# Patient Record
Sex: Female | Born: 1975 | Race: White | Hispanic: No | Smoking: Former smoker
Health system: Southern US, Community
[De-identification: ages and names within clinical notes are randomized; demographics above are authoritative.]

## PROBLEM LIST (undated history)

## (undated) DIAGNOSIS — M1990 Unspecified osteoarthritis, unspecified site: Secondary | ICD-10-CM

## (undated) DIAGNOSIS — K5790 Diverticulosis of intestine, part unspecified, without perforation or abscess without bleeding: Secondary | ICD-10-CM

## (undated) DIAGNOSIS — K649 Unspecified hemorrhoids: Secondary | ICD-10-CM

## (undated) DIAGNOSIS — M797 Fibromyalgia: Secondary | ICD-10-CM

## (undated) DIAGNOSIS — R51 Headache: Secondary | ICD-10-CM

## (undated) HISTORY — DX: Unspecified osteoarthritis, unspecified site: M19.90

## (undated) HISTORY — DX: Diverticulosis of intestine, part unspecified, without perforation or abscess without bleeding: K57.90

## (undated) HISTORY — DX: Fibromyalgia: M79.7

## (undated) HISTORY — PX: OTHER SURGICAL HISTORY: SHX169

## (undated) HISTORY — DX: Unspecified hemorrhoids: K64.9

---

## 1997-09-30 ENCOUNTER — Other Ambulatory Visit: Admission: RE | Admit: 1997-09-30 | Discharge: 1997-09-30 | Payer: Self-pay | Admitting: Obstetrics and Gynecology

## 1997-11-22 ENCOUNTER — Encounter: Admission: RE | Admit: 1997-11-22 | Discharge: 1998-02-20 | Payer: Self-pay | Admitting: Obstetrics & Gynecology

## 1998-06-16 ENCOUNTER — Inpatient Hospital Stay (HOSPITAL_COMMUNITY): Admission: AD | Admit: 1998-06-16 | Discharge: 1998-06-19 | Payer: Self-pay | Admitting: Obstetrics and Gynecology

## 1998-07-24 ENCOUNTER — Other Ambulatory Visit: Admission: RE | Admit: 1998-07-24 | Discharge: 1998-07-24 | Payer: Self-pay | Admitting: Obstetrics and Gynecology

## 1999-05-14 HISTORY — PX: DIAGNOSTIC LAPAROSCOPY: SUR761

## 1999-07-24 ENCOUNTER — Other Ambulatory Visit: Admission: RE | Admit: 1999-07-24 | Discharge: 1999-07-24 | Payer: Self-pay | Admitting: Obstetrics and Gynecology

## 2000-01-04 ENCOUNTER — Encounter (INDEPENDENT_AMBULATORY_CARE_PROVIDER_SITE_OTHER): Payer: Self-pay

## 2000-01-04 ENCOUNTER — Other Ambulatory Visit: Admission: RE | Admit: 2000-01-04 | Discharge: 2000-01-04 | Payer: Self-pay | Admitting: Obstetrics and Gynecology

## 2000-04-24 ENCOUNTER — Ambulatory Visit (HOSPITAL_COMMUNITY): Admission: RE | Admit: 2000-04-24 | Discharge: 2000-04-24 | Payer: Self-pay | Admitting: Obstetrics and Gynecology

## 2000-04-24 ENCOUNTER — Encounter (INDEPENDENT_AMBULATORY_CARE_PROVIDER_SITE_OTHER): Payer: Self-pay | Admitting: Specialist

## 2000-05-23 ENCOUNTER — Encounter (INDEPENDENT_AMBULATORY_CARE_PROVIDER_SITE_OTHER): Payer: Self-pay

## 2000-05-23 ENCOUNTER — Other Ambulatory Visit: Admission: RE | Admit: 2000-05-23 | Discharge: 2000-05-23 | Payer: Self-pay | Admitting: Obstetrics and Gynecology

## 2000-10-07 ENCOUNTER — Other Ambulatory Visit: Admission: RE | Admit: 2000-10-07 | Discharge: 2000-10-07 | Payer: Self-pay | Admitting: Obstetrics and Gynecology

## 2008-05-13 HISTORY — PX: ABDOMINAL HYSTERECTOMY: SHX81

## 2008-07-26 ENCOUNTER — Ambulatory Visit (HOSPITAL_COMMUNITY): Admission: RE | Admit: 2008-07-26 | Discharge: 2008-07-27 | Payer: Self-pay | Admitting: Obstetrics and Gynecology

## 2008-07-26 ENCOUNTER — Encounter (INDEPENDENT_AMBULATORY_CARE_PROVIDER_SITE_OTHER): Payer: Self-pay | Admitting: Obstetrics and Gynecology

## 2010-08-23 LAB — COMPREHENSIVE METABOLIC PANEL
ALT: 19 U/L (ref 0–35)
AST: 16 U/L (ref 0–37)
Albumin: 3.4 g/dL — ABNORMAL LOW (ref 3.5–5.2)
Alkaline Phosphatase: 40 U/L (ref 39–117)
Calcium: 8.6 mg/dL (ref 8.4–10.5)
GFR calc Af Amer: >60 mL/min (ref >60)
Potassium: 3.4 mEq/L — ABNORMAL LOW (ref 3.5–5.1)
Sodium: 136 mEq/L (ref 135–145)
Total Protein: 6 g/dL (ref 6.0–8.3)

## 2010-08-23 LAB — CBC
HCT: 31.9 % — ABNORMAL LOW (ref 36.0–46.0)
Hemoglobin: 11 g/dL — ABNORMAL LOW (ref 12.0–15.0)
MCHC: 34.3 g/dL (ref 30.0–36.0)
MCHC: 33.9 g/dL (ref 30.0–36.0)
Platelets: 138 10*3/uL — ABNORMAL LOW (ref 150–400)
Platelets: 166 10*3/uL (ref 150–400)
RDW: 13.3 % (ref 11.5–15.5)
RDW: 13.4 % (ref 11.5–15.5)

## 2010-08-23 LAB — HCG, SERUM, QUALITATIVE: Preg, Serum: NEGATIVE

## 2010-09-25 NOTE — Op Note (Signed)
[name], [name]                 ACCOUNT NO.:  [id number]   MEDICAL RECORD NO.:  [id number]          PATIENT TYPE:  OIB   LOCATION:  9304                          FACILITY:  [facility]   PHYSICIAN:  Guy Sandifer. Henderson Cloud, M.D. DATE OF BIRTH:  Jun 28, 1975   DATE OF PROCEDURE:  07/26/2008  DATE OF DISCHARGE:                               OPERATIVE REPORT   PREOPERATIVE DIAGNOSES:  Dysmenorrhea and menorrhagia.   POSTOPERATIVE DIAGNOSES:  Endometriosis, menorrhagia, and left ovarian  cyst.   PROCEDURE:  Laparoscopically-assisted vaginal hysterectomy, aspiration  of left ovarian cyst, and ablation of endometriosis.   SURGEON:  Guy Sandifer. Henderson Cloud, MD   ASSISTANT:  Duke Salvia. Marcelle Overlie, MD   ANESTHESIA:  General endotracheal intubation.   SPECIMENS:  Uterus to pathology.   ESTIMATED BLOOD LOSS:  200 mL.   INDICATIONS AND CONSENT:  This patient is a 35 year old divorced white  female G2 P2, having frequent painful heavy menses.  Details are  dictated in the history and physical.  She is being admitted for  surgical management.  Laparoscopically-assisted vaginal hysterectomy and  removal of 1 tube and ovary if distinctly abnormal have been discussed  preoperatively.  Potential risks and complications have been discussed  with the patient including, but not limited to infection, organ damage,  bleeding requiring transfusion of blood products with HIV and hepatitis  acquisition, DVT, PE, pneumonia, fistula formation, pelvic pain,  dyspareunia, laparotomy.  All questions have been answered and consent  is signed on the chart.   FINDINGS:  Upper abdomen is grossly normal.  Appendix is normal.  Uterus  is 8 weeks' in size.  Anterior cul-de-sac contains multiple dark black  powder burn type endometriotic lesions approximately 3-6-mm in diameter.  There is an implant in the posterior cul-de-sac on the left-hand side as  well as a implant on the left pelvic sidewall below the course of the  left  ureter.  There is a 3 cm smooth translucent cyst on the left ovary.   PROCEDURE:  The patient was taken to the operating room where she is  identified, placed in dorsal supine position, and general anesthesia was  induced via endotracheal intubation.  She is then placed in the dorsal  lithotomy position where she is prepped, bladder straight catheterized.  Hulka tenaculum was placed.  The uterus was manipulated.  She is draped  in a sterile fashion.  Time-out was conducted.  The infraumbilical and  suprapubic areas were then injected with 6 mL total of 1% plain  Xylocaine.  A small infraumbilical incision is made.  The Veress needle  was placed.  However, it is felt to be subfascial.  Therefore, an open  laparoscopy was done.  The fascia is grasped and under good  visualization is carefully entered and the peritoneum and bluntly  entered.  Anchoring sutures of 0 Vicryl were placed on each angle of the  fascia and a disposable Hassan trocar sleeve was placed and tied down  with the anchoring sutures.  Pneumoperitoneum is created.  A small  suprapubic incision was made in the midline and a 5-mm  disposable trocar  sleeve was placed under direct visualization.  The balloon on the 5-mm  trocar sleeve was inflated.  The above findings were noted.  The areas  of endometriosis were ablated with bipolar cautery.  Care was taken to  avoid the course of the ureters.  The left ovarian cyst is aspirated.  Only a few bubbles of the cyst fluid make it to the syringe.  Small  amount of serous fluid is noted to egress from the cyst.  The cyst wall  was grasped with the EnSeal bipolar cautery and good hemostasis was  obtained and the cyst wall was obliterated.  Then using the EnSeal  bipolar cautery cutting instruments, proximal ligaments were taken down  bilaterally to the level of the vesicouterine peritoneum.  Good  hemostasis is noted.  The vesicouterine peritoneum is taken down  cephalad laterally  with scissors.  Instruments are removed and attention  is turned to the vagina.  Posterior cul-de-sacs entered sharply.  Cervix  is circumscribed unipolar cautery.  Mucosa is advanced sharply and  bluntly.  Then using the gyrus bipolar cautery instrument, uterosacral  ligaments were taken down followed by the bladder pillars, cardinal  ligaments, and the uterine vessels.  Fundus is delivered posteriorly.  Remaining pedicles were taken down, and the specimen was delivered.  Uterosacral ligaments were then plicated at the vaginal cuff with  separate sutures of 0 Monocryl.  All suture will be 0 Monocryl unless  otherwise designated.  Another suture was placed at 3 and 9 o'clock  position of the cuff to ensure complete hemostasis.  Cuff was then  closed with figure-of-eights.  Foley catheter was placed in the bladder  and clear urine is noted.  Attention was returned to the abdomen.  Pneumoperitoneum was recreated.  Copious irrigation is carried out and  careful inspection reveals good hemostasis all around.  A 14 mL of the  remaining 1% Xylocaine plain were then instilled into the peritoneal  cavity.  Trocar sleeves were removed.  The fascia of the umbilical  incision was closed with 0 Vicryl under good visualization.  Skin on  both incisions was closed with subcuticular 3-0 Vicryl suture.  Dermabond was placed on both incisions.  All counts were correct.  The  patient is awakened, taken to recovery room in stable condition.      Guy Sandifer Henderson Cloud, M.D.  Electronically Signed     JET/MEDQ  D:  07/26/2008  T:  07/26/2008  Job:  [id number]

## 2010-09-25 NOTE — Discharge Summary (Signed)
[name], [name]                 ACCOUNT NO.:  [id number]   MEDICAL RECORD NO.:  [id number]          PATIENT TYPE:  OIB   LOCATION:  9304                          FACILITY:  [facility]   PHYSICIAN:  Guy Sandifer. Henderson Cloud, M.D. DATE OF BIRTH:  1975-09-26   DATE OF ADMISSION:  07/26/2008  DATE OF DISCHARGE:  07/27/2008                               DISCHARGE SUMMARY   ADMITTING DIAGNOSES:  1. Dysmenorrhea.  2. Menorrhagia.   DISCHARGE DIAGNOSES:  1. Endometriosis.  2. Menorrhagia.  3. Left ovarian cyst.   PROCEDURE:  On July 26, 2008, laparoscopically assisted vaginal  hysterectomy, aspiration of left ovarian cyst, and ablation of  endometriosis.   REASON FOR ADMISSION:  This patient is a 35 year old divorced white  female G2, P2, with heavy frequent menses.  Details are dictated in the  history and physical.  She is admitted for surgical management.   HOSPITAL COURSE:  The patient has been in the hospital and undergoes the  above procedure.  Estimated blood loss is 200 mL.  On the evening of  surgery, she has changed from Dilaudid to morphine PCA for better pain  control.  Vital signs are stable.  She is afebrile.  Urine output is  clear.  Abdomen is flat and soft with good bowel sounds.  On the day of  discharge, she is tolerating regular diet, passing flatus, ambulating  well.  She remains afebrile with stable vital signs.  Hemoglobin is  11.0.  Pathology is pending.   CONDITION ON DISCHARGE:  Good.   DIET:  Regular as tolerated.   ACTIVITY:  No lifting, no operation of automobiles, and no vaginal  entry.  She is to call the office for problems including, not limited to  temperature of 101 degrees, persistent nausea, vomiting, heavy bleeding,  or increasing pain.   MEDICATIONS:  1. Percocet 5/325 mg #50 one to two p.o. q.6 h. p.r.n., no refills.  2. Phenergan 25 mg #20 one p.o. q.6 h. p.r.n. nausea, vomiting, no      refills.  3. Multivitamin daily.  4. Naproxen q.12 h.  p.r.n.   FOLLOWUP:  In the office in 2 weeks.      Guy Sandifer Henderson Cloud, M.D.  Electronically Signed     JET/MEDQ  D:  07/27/2008  T:  07/28/2008  Job:  [id number]

## 2010-09-25 NOTE — H&P (Signed)
NAMESADHANA, [name]                 ACCOUNT NO.:  [id number]   MEDICAL RECORD NO.:  [id number]          PATIENT TYPE:  AMB   LOCATION:                                FACILITY:  [facility]   PHYSICIAN:  Guy Sandifer. Henderson Cloud, M.D. DATE OF BIRTH:  February 28, 1976   DATE OF ADMISSION:  07/26/2008  DATE OF DISCHARGE:                              HISTORY & PHYSICAL   CHIEF COMPLAINT:  Heavy painful menses.   HISTORY OF PRESENT ILLNESS:  The patient is a 35 year old divorced white  female, G2, P2,who is having menses about every 2 weeks.  She has a  known history of endometriosis and is having increasingly severe  premenstrual pain.  After discussion of options, she is being admitted  for laparoscopically-assisted vaginal hysterectomy and possible removal  of one tube and ovary if abnormal.  Potential risks and complications  have been discussed preoperatively.   PAST MEDICAL HISTORY:  1. History of abnormal Pap smear.  2. History of migraine headache.  3. The patient lost 120 pounds with diet and exercise in the last 3-4      years.   SURGICAL HISTORY:  Laparoscopy in 2001.   OBSTETRIC HISTORY:  Cesarean section x2.   FAMILY HISTORY:  Positive for heart disease, respiratory disease,  hepatitis, tuberculosis, urinary tract infection, diverticulosis,  arthritis, diabetes, hypertension, and cancer.   SOCIAL HISTORY:  Smokes a pack a day.  Denies drug or alcohol abuse.   MEDICATIONS:  Phenergan p.r.n., hydrocodone p.r.n.   No known drug allergies.   REVIEW OF SYSTEMS:  NEUROLOGIC:  History of headache as above.  CARDIAC:  Denies chest pain.  PULMONARY:  Denies shortness of breath.   PHYSICAL EXAMINATION:  VITAL SIGNS:  Height 5 feet 8 inches, weight  140.2 pounds, blood pressure 120/88.  HEENT: Without thyromegaly.  LUNGS:  Clear to auscultation.  HEART:  Regular rate and rhythm.  BACK:  No CVA tenderness.  BREASTS:  No mass, traction, or discharge.  ABDOMEN:  Soft, nontender without masses.  PELVIC:  Vulva, vagina, cervix without lesion.  Uterus is anteverted,  normal size, mobile.  Left adnexa tender without palpable masses.  Right  adnexa nontender without masses.  EXTREMITIES:  Grossly within normal limits.  NEUROLOGIC:  Grossly within normal limits.   ASSESSMENT:  Dysmenorrhea, menorrhagia.   PLAN:  Laparoscopically-assisted vaginal hysterectomy, removal of tube  and ovary if abnormal.      Guy Sandifer. Henderson Cloud, M.D.  Electronically Signed     JET/MEDQ  D:  07/21/2008  T:  07/22/2008  Job:  [id number]

## 2010-09-28 NOTE — H&P (Signed)
[facility]  Patient:    [name], [name]                          MRN: [id number] Adm. Date:  04/24/00 Attending:  Guy Sandifer. Arleta Creek, M.D.                         History and Physical  CHIEF COMPLAINT:              Pelvic pain.  HISTORY OF PRESENT ILLNESS:   This patient is a 35 year old white female, G2, P2, who has increasingly severe pelvic pain and dysmenorrhea.  She gets only partial relief at best with the birth control pill.  After discussion of the options, she is being admitted for laparoscopy.  PAST MEDICAL HISTORY:         1. Renal reflux age 55.                               2. Migraine headaches.                               3. History of depression.                               4. Ruptured ovarian cyst at age 21.                               5. Chlamydia 1992.                               6. Cervical dysplasia.  PAST SURGICAL HISTORY:        Negative.  MEDICATIONS:                  Wellbutrin 150 mg b.i.d., Estrostep q.d., Xanax p.r.n.  ALLERGIES:                    CODEINE - leads to nausea.  OBSTETRIC HISTORY:            Status post cesarean section x 2.  SOCIAL HISTORY:               Patient smokes less than one pack of cigarettes a day.  Denies alcohol or drug abuse.  REVIEW OF SYSTEMS:            Negative, except as above.  PHYSICAL EXAMINATION:  GENERAL:                      Height 5 feet, 7 inches, weight 210 pounds.  VITAL SIGNS:                  Blood pressure 118/80.  HEENT:                        Without thyromegaly.  LUNGS:                        Clear to auscultation.  HEART:  Regular rate and rhythm.  BACK:                         Without CVA tenderness.  BREASTS:                      Without mass, retraction, discharge.  ABDOMEN:                      Obese, soft, nontender.  PELVIC EXAMINATION:           Vulva, vagina, cervix without lesion.  Uterus is anteverted, normal size,  mildly tender.  Adnexa nontender without masses.  EXTREMITIES/NEUROLOGIC:       Grossly within normal limits.  ASSESSMENT:                   Dysmenorrhea and pelvic pain.  PLAN:                         Laparoscopy. DD:  04/23/00 TD:  04/23/00 Job: [id number]

## 2010-09-28 NOTE — Op Note (Signed)
[facility]  Patient:    [name], [name]                        MRN: [id number] Proc. Date: 04/24/00 Adm. Date:  [id number] Attending:  Cordelia Pen Ii                           Operative Report  PREOPERATIVE DIAGNOSIS:       Pelvic pain.  POSTOPERATIVE DIAGNOSIS:      Endometriosis and pelvic adhesions.  OPERATION:                    Laparoscopy with ablation and resection of                               endometriosis and lysis of adhesions.  SURGEON:                      Guy Sandifer. Arleta Creek, M.D.  ANESTHESIA:                   General with endotracheal intubation.  ESTIMATED BLOOD LOSS:         Drops.  INDICATIONS AND CONSENT:      This patient is a 35 year old white female, G2, P2, with pelvic pain.  Details are dictated in the history and physical. Laparoscopy has been discussed with the patient.  Possible risks and complications have been discussed, including but not limited to infection, bowel, bladder, or ureteral damage, bleeding requiring transfusion of blood products with possible transfusion reaction, HIV, and hepatitis acquisition, DVT, PE, pneumonia.  All questions are answered, and consent is signed on the chart.  FINDINGS:                     Upper abdomen appears normal.  Uterus is normal in size and contour.  Anterior cul-de-sac contains adhesion in the center of the vesicouterine peritoneum.  Posterior cul-de-sac contains several dark-black and white implants of endometriosis.  Left pelvic sidewall contains puckered white peritoneum consistent with endometriosis just above the course of the left ureter.  Right pelvic sidewall contains two small areas possibly consistent with endometriosis below the course of the ureter and above the course of the ureter.  Tubes and ovaries are normal bilaterally.  There are also several adhesions of the epiploicae to the left pelvic brim.  DESCRIPTION OF PROCEDURE:     The patient was  taken to the operating room and placed in dorsal supine position, where general anesthesia is induced via endotracheal intubation.  She is then placed in the dorsal lithotomy position, where she is prepped abdominally and vaginally, bladder straight-catheterized.  Hulka tenaculum was placed in the uterus as a manipulator, and she is draped in a sterile fashion.  Small infraumbilical incision is made, and a Veress needle is placed.  Syringe and drop test are normal.  The abdomen is filled with approximately 4 L of gas.  Good percussion is noted.  Veress needle is removed, and the disposable 10 mm trocar sleeve is placed without difficulty. Placement is verified with the laparoscope, and no damage to surrounding structures is noted.  The above findings are noted.  A small incision is made in the peritoneum above the level of the left pelvic sidewall endometriosis. Hydrodissection is carried out to elevate the  peritoneum well away from the course of the ureter.  The implants are then dissected out and sent as a pathology specimen.  Minor bipolar cautery at the peritoneal edges is used to obtain hemostasis while carefully elevating the peritoneum away from the sidewall structures.  The implants in the posterior cul-de-sac are ablated with bipolar cautery.  The same is true for the ones on the right pelvic sidewall.  The adhesions on the vesicouterine peritoneum as well as the left pelvic brim are taken down sharply without difficulty.  Good hemostasis is noted all around.  Irrigation is carried out, and all return is clear. Suprapubic trocar sleeve is removed and pneumoperitoneum is reduced, and no bleeding is noted.  The pneumoperitoneum is completely reduced, and the umbilical trocar sleeve is removed.  The incisions are closed with Dermabond. Hulka tenaculum is removed, and no bleeding is noted.  All counts are correct. The patient is awakened, taken to the recovery room in stable  condition. DD:  04/24/00 TD:  04/24/00 Job: [id number]

## 2010-11-11 HISTORY — PX: APPENDECTOMY: SHX54

## 2011-04-18 ENCOUNTER — Other Ambulatory Visit: Payer: Self-pay | Admitting: Obstetrics and Gynecology

## 2011-04-18 ENCOUNTER — Encounter (HOSPITAL_COMMUNITY): Payer: Self-pay | Admitting: Pharmacist

## 2011-04-24 ENCOUNTER — Encounter (HOSPITAL_COMMUNITY): Payer: Self-pay

## 2011-04-24 ENCOUNTER — Encounter (HOSPITAL_COMMUNITY)
Admission: RE | Admit: 2011-04-24 | Discharge: 2011-04-24 | Disposition: A | Discharge status: Home / Self Care | Payer: PRIVATE HEALTH INSURANCE | Source: Ambulatory Visit | Attending: Obstetrics and Gynecology | Admitting: Obstetrics and Gynecology

## 2011-04-24 LAB — CBC
HCT: 39.4 % (ref 36.0–46.0)
Hemoglobin: 13.6 g/dL (ref 12.0–15.0)
MCH: 33.7 pg (ref 26.0–34.0)
MCHC: 34.5 g/dL (ref 30.0–36.0)
MCV: 97.8 fL (ref 78.0–100.0)

## 2011-04-24 LAB — SURGICAL PCR SCREEN: Staphylococcus aureus: POSITIVE — AB

## 2011-04-24 NOTE — Patient Instructions (Addendum)
20 [name]  04/24/2011   Your procedure is scheduled on:  04/26/11 07:30  Enter through the Main Entrance of [facility] at 600 AM.  Pick up the phone at the desk and dial [phone].   Call this number if you have problems the morning of surgery: [phone]   Remember:   Do not eat food:After Midnight.  Do not drink clear liquids: After Midnight.  Take these medicines the morning of surgery with A SIP OF WATER: NA   Do not wear jewelry, make-up or nail polish.  Do not wear lotions, powders, or perfumes. You may wear deodorant.  Do not shave 48 hours prior to surgery.  Do not bring valuables to the hospital.  Contacts, dentures or bridgework may not be worn into surgery.  Leave suitcase in the car. After surgery it may be brought to your room.  For patients admitted to the hospital, checkout time is 11:00 AM the day of discharge.   Patients discharged the day of surgery will not be allowed to drive home.  Name and phone number of your driver: Janace Litten  Special Instructions: CHG Shower Use Special Wash: 1/2 bottle night before surgery and 1/2 bottle morning of surgery.   Please read over the following fact sheets that you were given: MRSA Information

## 2011-04-26 ENCOUNTER — Other Ambulatory Visit: Payer: Self-pay | Admitting: Obstetrics and Gynecology

## 2011-04-26 ENCOUNTER — Encounter (HOSPITAL_COMMUNITY)
Admission: RE | Disposition: A | Discharge status: Home / Self Care | Payer: Self-pay | Source: Ambulatory Visit | Attending: Obstetrics and Gynecology

## 2011-04-26 ENCOUNTER — Ambulatory Visit (HOSPITAL_COMMUNITY): Payer: PRIVATE HEALTH INSURANCE | Admitting: Anesthesiology

## 2011-04-26 ENCOUNTER — Encounter (HOSPITAL_COMMUNITY): Payer: Self-pay | Admitting: *Deleted

## 2011-04-26 ENCOUNTER — Ambulatory Visit (HOSPITAL_COMMUNITY)
Admission: RE | Admit: 2011-04-26 | Discharge: 2011-04-26 | Disposition: A | Discharge status: Home / Self Care | Payer: PRIVATE HEALTH INSURANCE | Source: Ambulatory Visit | Attending: Obstetrics and Gynecology | Admitting: Obstetrics and Gynecology

## 2011-04-26 ENCOUNTER — Encounter (HOSPITAL_COMMUNITY): Payer: Self-pay | Admitting: Anesthesiology

## 2011-04-26 DIAGNOSIS — Z01818 Encounter for other preprocedural examination: Secondary | ICD-10-CM | POA: Insufficient documentation

## 2011-04-26 DIAGNOSIS — Z01812 Encounter for preprocedural laboratory examination: Secondary | ICD-10-CM | POA: Insufficient documentation

## 2011-04-26 DIAGNOSIS — N803 Endometriosis of pelvic peritoneum, unspecified: Secondary | ICD-10-CM | POA: Insufficient documentation

## 2011-04-26 DIAGNOSIS — R1032 Left lower quadrant pain: Secondary | ICD-10-CM | POA: Insufficient documentation

## 2011-04-26 DIAGNOSIS — N83209 Unspecified ovarian cyst, unspecified side: Secondary | ICD-10-CM | POA: Insufficient documentation

## 2011-04-26 DIAGNOSIS — Z90710 Acquired absence of both cervix and uterus: Secondary | ICD-10-CM | POA: Insufficient documentation

## 2011-04-26 HISTORY — PX: SALPINGOOPHORECTOMY: SHX82

## 2011-04-26 HISTORY — DX: Headache: R51

## 2011-04-26 HISTORY — PX: LAPAROSCOPY: SHX197

## 2011-04-26 SURGERY — LAPAROSCOPY OPERATIVE
Anesthesia: General | Site: Abdomen | Wound class: Clean Contaminated

## 2011-04-26 MED ORDER — KETOROLAC TROMETHAMINE 30 MG/ML IJ SOLN
INTRAMUSCULAR | Status: DC | PRN
  Administered 2011-04-26: 30 mg via INTRAVENOUS

## 2011-04-26 MED ORDER — ROCURONIUM BROMIDE 100 MG/10ML IV SOLN
INTRAVENOUS | Status: DC | PRN
  Administered 2011-04-26: 40 mg via INTRAVENOUS

## 2011-04-26 MED ORDER — CEFAZOLIN SODIUM 1-5 GM-% IV SOLN
1.0000 g | INTRAVENOUS | Status: AC
  Administered 2011-04-26: 1 g via INTRAVENOUS

## 2011-04-26 MED ORDER — ALBUTEROL SULFATE HFA 108 (90 BASE) MCG/ACT IN AERS
INHALATION_SPRAY | RESPIRATORY_TRACT | Status: AC
  Filled 2011-04-26: qty 6.7

## 2011-04-26 MED ORDER — LACTATED RINGERS IV SOLN: INTRAVENOUS | Status: DC

## 2011-04-26 MED ORDER — FENTANYL CITRATE 0.05 MG/ML IJ SOLN
25.0000 ug | INTRAMUSCULAR | Status: DC | PRN
  Administered 2011-04-26 (×2): 50 ug via INTRAVENOUS

## 2011-04-26 MED ORDER — FENTANYL CITRATE 0.05 MG/ML IJ SOLN
INTRAMUSCULAR | Status: DC | PRN
  Administered 2011-04-26: 100 ug via INTRAVENOUS
  Administered 2011-04-26 (×3): 50 ug via INTRAVENOUS

## 2011-04-26 MED ORDER — LIDOCAINE HCL (CARDIAC) 20 MG/ML IV SOLN
INTRAVENOUS | Status: DC | PRN
  Administered 2011-04-26: 50 mg via INTRAVENOUS

## 2011-04-26 MED ORDER — HEPARIN SODIUM (PORCINE) 5000 UNIT/ML IJ SOLN
INTRAMUSCULAR | Status: DC | PRN
  Administered 2011-04-26: 5000 [IU]

## 2011-04-26 MED ORDER — ROCURONIUM BROMIDE 50 MG/5ML IV SOLN
INTRAVENOUS | Status: AC
  Filled 2011-04-26: qty 1

## 2011-04-26 MED ORDER — MIDAZOLAM HCL 5 MG/5ML IJ SOLN
INTRAMUSCULAR | Status: DC | PRN
  Administered 2011-04-26: 2 mg via INTRAVENOUS

## 2011-04-26 MED ORDER — ACETAMINOPHEN 325 MG PO TABS: 325.0000 mg | ORAL_TABLET | ORAL | Status: DC | PRN

## 2011-04-26 MED ORDER — LACTATED RINGERS IR SOLN
Status: DC | PRN
  Administered 2011-04-26: 3000 mL

## 2011-04-26 MED ORDER — MIDAZOLAM HCL 2 MG/2ML IJ SOLN
INTRAMUSCULAR | Status: AC
  Filled 2011-04-26: qty 2

## 2011-04-26 MED ORDER — LIDOCAINE HCL (CARDIAC) 20 MG/ML IV SOLN
INTRAVENOUS | Status: AC
  Filled 2011-04-26: qty 5

## 2011-04-26 MED ORDER — LACTATED RINGERS IV SOLN
INTRAVENOUS | Status: DC
  Administered 2011-04-26 (×3): via INTRAVENOUS

## 2011-04-26 MED ORDER — ALBUTEROL SULFATE HFA 108 (90 BASE) MCG/ACT IN AERS
INHALATION_SPRAY | RESPIRATORY_TRACT | Status: DC | PRN
  Administered 2011-04-26: 4 via RESPIRATORY_TRACT

## 2011-04-26 MED ORDER — HYDROCODONE-ACETAMINOPHEN 5-325 MG PO TABS: 1.0000 | ORAL_TABLET | Freq: Four times a day (QID) | ORAL | Status: DC | PRN

## 2011-04-26 MED ORDER — BUPIVACAINE HCL (PF) 0.25 % IJ SOLN
INTRAMUSCULAR | Status: DC | PRN
  Administered 2011-04-26: 30 mL

## 2011-04-26 MED ORDER — FENTANYL CITRATE 0.05 MG/ML IJ SOLN
INTRAMUSCULAR | Status: AC
  Administered 2011-04-26: 50 ug via INTRAVENOUS
  Filled 2011-04-26: qty 2

## 2011-04-26 MED ORDER — NEOSTIGMINE METHYLSULFATE 1 MG/ML IJ SOLN
INTRAMUSCULAR | Status: DC | PRN
  Administered 2011-04-26: 2 mg via INTRAVENOUS

## 2011-04-26 MED ORDER — ONDANSETRON HCL 4 MG/2ML IJ SOLN
INTRAMUSCULAR | Status: DC | PRN
  Administered 2011-04-26: 4 mg via INTRAVENOUS

## 2011-04-26 MED ORDER — PROPOFOL 10 MG/ML IV EMUL
INTRAVENOUS | Status: AC
  Filled 2011-04-26: qty 20

## 2011-04-26 MED ORDER — PROMETHAZINE HCL 12.5 MG PO TABS: 25.0000 mg | ORAL_TABLET | Freq: Four times a day (QID) | ORAL | Status: AC | PRN

## 2011-04-26 MED ORDER — GLYCOPYRROLATE 0.2 MG/ML IJ SOLN
INTRAMUSCULAR | Status: AC
  Filled 2011-04-26: qty 1

## 2011-04-26 MED ORDER — GLYCOPYRROLATE 0.2 MG/ML IJ SOLN
INTRAMUSCULAR | Status: DC | PRN
  Administered 2011-04-26: .4 mg via INTRAVENOUS

## 2011-04-26 MED ORDER — KETOROLAC TROMETHAMINE 30 MG/ML IJ SOLN: 15.0000 mg | Freq: Once | INTRAMUSCULAR | Status: DC | PRN

## 2011-04-26 MED ORDER — FENTANYL CITRATE 0.05 MG/ML IJ SOLN
INTRAMUSCULAR | Status: AC
  Filled 2011-04-26: qty 5

## 2011-04-26 MED ORDER — ONDANSETRON HCL 4 MG/2ML IJ SOLN
INTRAMUSCULAR | Status: AC
  Filled 2011-04-26: qty 2

## 2011-04-26 MED ORDER — HYDROCODONE-ACETAMINOPHEN 5-325 MG PO TABS
ORAL_TABLET | ORAL | Status: AC
  Administered 2011-04-26: 1 via ORAL
  Filled 2011-04-26: qty 1

## 2011-04-26 MED ORDER — SODIUM CHLORIDE 0.9 % IJ SOLN
INTRAMUSCULAR | Status: DC | PRN
  Administered 2011-04-26: 10 mL

## 2011-04-26 MED ORDER — NEOSTIGMINE METHYLSULFATE 1 MG/ML IJ SOLN
INTRAMUSCULAR | Status: AC
  Filled 2011-04-26: qty 10

## 2011-04-26 MED ORDER — PROPOFOL 10 MG/ML IV EMUL
INTRAVENOUS | Status: DC | PRN
  Administered 2011-04-26: 40 mg via INTRAVENOUS
  Administered 2011-04-26: 200 mg via INTRAVENOUS

## 2011-04-26 MED ORDER — HYDROCODONE-ACETAMINOPHEN 5-325 MG PO TABS
1.0000 | ORAL_TABLET | Freq: Once | ORAL | Status: AC
  Administered 2011-04-26: 1 via ORAL

## 2011-04-26 MED ORDER — CEFAZOLIN SODIUM 1-5 GM-% IV SOLN
INTRAVENOUS | Status: AC
  Filled 2011-04-26: qty 50

## 2011-04-26 MED ORDER — PROMETHAZINE HCL 25 MG/ML IJ SOLN: 6.2500 mg | INTRAMUSCULAR | Status: DC | PRN

## 2011-04-26 SURGICAL SUPPLY — 29 items
ADH SKN CLS APL DERMABOND .7 (GAUZE/BANDAGES/DRESSINGS) ×4
BAG SPEC RTRVL LRG 6X4 10 (ENDOMECHANICALS)
BARRIER ADHS 3X4 INTERCEED (GAUZE/BANDAGES/DRESSINGS) IMPLANT
BRR ADH 4X3 ABS CNTRL BYND (GAUZE/BANDAGES/DRESSINGS)
CABLE HIGH FREQUENCY MONO STRZ (ELECTRODE) IMPLANT
CATH ROBINSON RED A/P 16FR (CATHETERS) ×3 IMPLANT
CLOTH BEACON ORANGE TIMEOUT ST (SAFETY) ×3 IMPLANT
DERMABOND ADVANCED (GAUZE/BANDAGES/DRESSINGS) ×2
DERMABOND ADVANCED .7 DNX12 (GAUZE/BANDAGES/DRESSINGS) ×2 IMPLANT
GLOVE BIO SURGEON STRL SZ8 (GLOVE) ×6 IMPLANT
GOWN PREVENTION PLUS LG XLONG (DISPOSABLE) ×3 IMPLANT
NDL INSUFFLATION 14GA 120MM (NEEDLE) ×2 IMPLANT
NEEDLE INSUFFLATION 14GA 120MM (NEEDLE) ×3 IMPLANT
NS IRRIG 1000ML POUR BTL (IV SOLUTION) ×3 IMPLANT
PACK LAPAROSCOPY BASIN (CUSTOM PROCEDURE TRAY) ×3 IMPLANT
POUCH SPECIMEN RETRIEVAL 10MM (ENDOMECHANICALS) IMPLANT
SEALER TISSUE G2 CVD JAW 35 (ENDOMECHANICALS) IMPLANT
SEALER TISSUE G2 CVD JAW 45CM (ENDOMECHANICALS) IMPLANT
SET IRRIG TUBING LAPAROSCOPIC (IRRIGATION / IRRIGATOR) ×1 IMPLANT
STRIP CLOSURE SKIN 1/4X3 (GAUZE/BANDAGES/DRESSINGS) IMPLANT
SUT VIC AB 3-0 PS2 18 (SUTURE)
SUT VIC AB 3-0 PS2 18XBRD (SUTURE) IMPLANT
SUT VICRYL 0 UR6 27IN ABS (SUTURE) ×3 IMPLANT
SYR 30ML LL (SYRINGE) IMPLANT
TOWEL OR 17X24 6PK STRL BLUE (TOWEL DISPOSABLE) ×6 IMPLANT
TROCAR XCEL NON-BLD 11X100MML (ENDOMECHANICALS) ×1 IMPLANT
TROCAR XCEL NON-BLD 5MMX100MML (ENDOMECHANICALS) ×1 IMPLANT
WARMER LAPAROSCOPE (MISCELLANEOUS) ×3 IMPLANT
WATER STERILE IRR 1000ML POUR (IV SOLUTION) ×2 IMPLANT

## 2011-04-26 NOTE — H&P (Signed)
[name] is an 35 y.o. female. S/P LAVH now with LLQ pain on/off.  U/S has 1.5-2.0 cm solid lesion in left ovary.  Pertinent Gynecological History: Menses: NA Bleeding: NA Contraception: none DES exposure: unknown Blood transfusions: none Sexually transmitted diseases: no past history Previous GYN Procedures: LAVH  Last mammogram: NA Date: NA Last pap: normal Date: 2012 OB History: G?, P?   Menstrual History: Menarche age: unknown No LMP recorded.    Past Medical History  Diagnosis Date  . Headache     Migraines    Past Surgical History  Procedure Date  . Cesearean  '96 '00  . Diagnostic laparoscopy 2001  . Appendectomy 7/12  . Abdominal hysterectomy 2010    History reviewed. No pertinent family history.  Social History:  reports that she has been smoking.  She has never used smokeless tobacco. She reports that she drinks alcohol. She reports that she does not use illicit drugs.  Allergies: No Known Allergies  Prescriptions prior to admission  Medication Sig Dispense Refill  . acetaminophen (TYLENOL) 325 MG tablet Take 325-650 mg by mouth every 6 (six) hours as needed. For pain       . HYDROcodone-acetaminophen (NORCO) 5-325 MG per tablet Take 1 tablet by mouth every 6 (six) hours as needed. For pain         Review of Systems  Gastrointestinal: Positive for abdominal pain.       LLQ pain    Blood pressure 113/77, temperature 97.9 F (36.6 C), temperature source Oral, resp. rate 18, SpO2 95.00%. Physical Exam  Cardiovascular: Normal rate and regular rhythm.   Respiratory: Effort normal and breath sounds normal.  GI: There is no tenderness.    No results found for this or any previous visit (from the past 24 hour(s)).  No results found.  Assessment/Plan: 35 yo with LLQ pain and lesion of left ovary. For L/S with LSO. Potential risks/complications reviewed, all questions answered.  Rolando Whitby II,Chaysen Tillman E 04/26/2011, 7:20 AM

## 2011-04-26 NOTE — Brief Op Note (Signed)
04/26/2011  8:16 AM  PATIENT:  [name]  35 y.o. female with LLQ pain and left ovarian lesion  PRE-OPERATIVE DIAGNOSIS:  left ovarian cyst, LLQ pain  POST-OPERATIVE DIAGNOSIS:  left ovarian cyst, endometriosis  PROCEDURE:  Procedure(s): LAPAROSCOPY OPERATIVE SALPINGO OOPHERECTOMY, ablation of endometriosis  SURGEON:  Surgeon(s): Roselle Locus II  PHYSICIAN ASSISTANT:   ASSISTANTS: none   ANESTHESIA:   local and general  EBL:  Total I/O In: 1000 [I.V.:1000] Out: -   BLOOD ADMINISTERED:none  DRAINS: Urinary Catheter (Foley)   LOCAL MEDICATIONS USED:  MARCAINE 30CC  SPECIMEN:  Source of Specimen:  left fallopian tube and ovary  DISPOSITION OF SPECIMEN:  PATHOLOGY  COUNTS:  YES  TOURNIQUET:  * No tourniquets in log *  DICTATION: .Other Dictation: Dictation Number [id number]  PLAN OF CARE: Discharge to home after PACU  PATIENT DISPOSITION:  PACU - hemodynamically stable.   Delay start of Pharmacological VTE agent (>24hrs) due to surgical blood loss or risk of bleeding:  {YES/NO/NOT APPLICABLE:20182

## 2011-04-26 NOTE — Anesthesia Procedure Notes (Signed)
Procedure Name: Intubation Date/Time: 04/26/2011 7:33 AM Performed by: Madison Hickman Pre-anesthesia Checklist: Patient identified, Emergency Drugs available, Suction available, Timeout performed and Patient being monitored Patient Re-evaluated:Patient Re-evaluated prior to inductionOxygen Delivery Method: Circle System Utilized Preoxygenation: Pre-oxygenation with 100% oxygen Intubation Type: IV induction Ventilation: Mask ventilation without difficulty Laryngoscope Size: Mac and 3 Grade View: Grade I Tube type: Oral Tube size: 7.0 mm Number of attempts: 1 Placement Confirmation: ETT inserted through vocal cords under direct vision,  positive ETCO2 and breath sounds checked- equal and bilateral Secured at: 20 cm Tube secured with: Tape Dental Injury: Teeth and Oropharynx as per pre-operative assessment

## 2011-04-26 NOTE — Anesthesia Preprocedure Evaluation (Signed)
Anesthesia Evaluation  Patient identified by MRN, date of birth, ID band Patient awake    Reviewed: Allergy & Precautions, H&P , Patient's Chart, lab work & pertinent test results, reviewed documented beta blocker date and time   History of Anesthesia Complications Negative for: history of anesthetic complications  Airway Mallampati: II TM Distance: >3 FB Neck ROM: full    Dental No notable dental hx.    Pulmonary neg pulmonary ROS,  clear to auscultation  Pulmonary exam normal       Cardiovascular Exercise Tolerance: Good neg cardio ROS regular Normal    Neuro/Psych  Headaches, Negative Neurological ROS  Negative Psych ROS   GI/Hepatic negative GI ROS, Neg liver ROS,   Endo/Other  Negative Endocrine ROS  Renal/GU negative Renal ROS     Musculoskeletal   Abdominal   Peds  Hematology negative hematology ROS (+)   Anesthesia Other Findings   Reproductive/Obstetrics negative OB ROS                           Anesthesia Physical Anesthesia Plan  ASA: II  Anesthesia Plan: General ETT   Post-op Pain Management:    Induction:   Airway Management Planned:   Additional Equipment:   Intra-op Plan:   Post-operative Plan:   Informed Consent: I have reviewed the patients History and Physical, chart, labs and discussed the procedure including the risks, benefits and alternatives for the proposed anesthesia with the patient or authorized representative who has indicated his/her understanding and acceptance.   Dental Advisory Given  Plan Discussed with: CRNA and Surgeon  Anesthesia Plan Comments:         Anesthesia Quick Evaluation

## 2011-04-26 NOTE — Transfer of Care (Signed)
Immediate Anesthesia Transfer of Care Note  Patient: [name]  Procedure(s) Performed:  LAPAROSCOPY OPERATIVE - ablation of endometriosis; SALPINGO OOPHERECTOMY  Patient Location: PACU  Anesthesia Type: General  Level of Consciousness: awake, alert  and oriented  Airway & Oxygen Therapy: Patient Spontanous Breathing and Patient connected to nasal cannula oxygen  Post-op Assessment: Report given to PACU RN and Post -op Vital signs reviewed and stable  Post vital signs: Reviewed and stable  Complications: No apparent anesthesia complications

## 2011-04-26 NOTE — Op Note (Signed)
NAMEZARIE, KOSIBA NO.:  [id number]  MEDICAL RECORD NO.:  [id number]  LOCATION:  [facility]                          FACILITY:  [facility]  PHYSICIAN:  Guy Sandifer. Henderson Cloud, M.D. DATE OF BIRTH:  [birth year]  DATE OF PROCEDURE:  04/25/2011 DATE OF DISCHARGE:                              OPERATIVE REPORT   PREOPERATIVE DIAGNOSES:  Left lower quadrant pain and left ovarian cyst.  POSTOPERATIVE DIAGNOSES:  Left ovarian cyst and endometriosis.  PROCEDURE:  Laparoscopy with left salpingo-oophorectomy and ablation of endometriosis.  SURGEON:  Guy Sandifer. Henderson Cloud, MD  ANESTHESIA:  General endotracheal intubation.  ESTIMATED BLOOD LOSS:  Drops.  SPECIMEN:  Left tube and ovary, both to Pathology.  INDICATIONS AND CONSENT:  This patient is a 35 year old, status post laparoscopically assisted vaginal hysterectomy, with intermittent left lower quadrant pain.  Ultrasound reveals 1.5-2 cm solid lesion within the left ovary.  The right ovary appears normal.  After discussion of options, she is admitted for laparoscopy with left salpingo- oophorectomy.  Potential risks and complications were reviewed preoperatively including but not limited to infection, organ damage, bleeding requiring transfusion of blood products with HIV and hepatitis acquisition, DVT, PE, pneumonia, pelvic pain, painful intercourse, recurrent cyclic pain, and laparotomy.  All questions were answered and consent is signed on the chart.  FINDINGS:  Upper abdomen is grossly normal.  There is a dark black 5-mm implant of endometriosis on the left pelvic sidewall just above the pelvic brim.  There are dark red implants of endometriosis-type lesions over the left and right aspects of the bladder peritoneum.  PROCEDURE:  The patient was taken to the operating room.  She was identified and placed in dorsal supine position.  General anesthesia was induced via endotracheal intubation.  She was placed in dorsal  lithotomy position.  A time-out was undertaken.  She was prepped abdominally and vaginally and bladder straight catheterized.  A ring clamp with a single prep sponge was placed in the vagina and she was draped in a sterile fashion.  The infraumbilical and suprapubic areas were injected in the midline with 0.25% plain Marcaine.  A small infraumbilical incision was made.  Disposable Veress needle was placed with a good syringe and drop test.  Two liters of gas were insufflated under low pressure with good tympany in the right upper quadrant.  A 10/11 XL bladeless disposable trocar sleeve was placed using an diagnostic laparoscope under direct visualization.  After that, the operative laparoscope was used.  A small suprapubic incision was made in the midline and a 5-mm XL bladeless disposable trocar sleeve was placed under direct visualization without difficulty.  The above findings were noted.  The left ovary was mobile. Careful inspection reveals area of surgery to be well clear of ureter. Then, using the EnSeal bipolar cautery cutting instrument, the left infundibulopelvic ligament was taken down coming across and freeing the ovary completely.  Good hemostasis was maintained.  Specimens deposited in the cul-de-sac.  Then, switching to the 5-mm laparoscope through the suprapubic trocar sleeve in the Endocatch to the umbilical trocar sleeve, the specimen was retrieved without difficulty through the umbilical incision.  The 10/11 trocar sleeve was then  replaced and inspection with the operative laparoscope revealed excellent hemostasis. Bipolar cautery was used to cauterize the areas of endometriosis as listed above.  The remaining approximately 25 mL of 0.25% plain Marcaine was placed in the peritoneal cavity.  Suprapubic trocar sleeve was removed.  Pneumoperitoneum was reduced.  The umbilical trocar sleeve was removed.  Umbilical incision was closed with a 0 Vicryl under  good visualization, well clear of the underlying bowel.  Skin was closed with interrupted 3-0 Vicryl suture on the umbilical incision and Dermabond was placed on both incisions.  Ring clamp was removed from the vagina. All counts were correct.  The patient was awakened and taken to the recovery room in stable condition.     Guy Sandifer Henderson Cloud, M.D.     JET/MEDQ  D:  04/26/2011  T:  04/26/2011  Job:  [id number]

## 2011-04-29 ENCOUNTER — Encounter (HOSPITAL_COMMUNITY): Payer: Self-pay | Admitting: Obstetrics and Gynecology

## 2011-04-29 NOTE — Anesthesia Postprocedure Evaluation (Signed)
  Anesthesia Post-op Note  Patient: [name]  Procedure(s) Performed:  LAPAROSCOPY OPERATIVE - ablation of endometriosis; SALPINGO OOPHERECTOMY  Patient is awake and responsive. Pain and nausea are reasonably well controlled. Vital signs are stable and clinically acceptable. Oxygen saturation is clinically acceptable. There are no apparent anesthetic complications at this time. Patient is ready for discharge.

## 2013-09-08 ENCOUNTER — Other Ambulatory Visit: Payer: Self-pay | Admitting: Nurse Practitioner

## 2015-02-09 DIAGNOSIS — M5440 Lumbago with sciatica, unspecified side: Secondary | ICD-10-CM

## 2015-02-09 DIAGNOSIS — R202 Paresthesia of skin: Secondary | ICD-10-CM

## 2015-02-09 DIAGNOSIS — R200 Anesthesia of skin: Secondary | ICD-10-CM | POA: Insufficient documentation

## 2015-02-09 HISTORY — DX: Anesthesia of skin: R20.0

## 2015-02-09 HISTORY — DX: Lumbago with sciatica, unspecified side: M54.40

## 2015-02-09 HISTORY — DX: Paresthesia of skin: R20.2

## 2015-03-15 DIAGNOSIS — G43909 Migraine, unspecified, not intractable, without status migrainosus: Secondary | ICD-10-CM

## 2015-03-15 DIAGNOSIS — G43109 Migraine with aura, not intractable, without status migrainosus: Secondary | ICD-10-CM | POA: Insufficient documentation

## 2015-03-15 HISTORY — DX: Migraine, unspecified, not intractable, without status migrainosus: G43.909

## 2017-01-11 DIAGNOSIS — M797 Fibromyalgia: Secondary | ICD-10-CM

## 2017-01-11 DIAGNOSIS — M1990 Unspecified osteoarthritis, unspecified site: Secondary | ICD-10-CM

## 2017-01-11 HISTORY — DX: Unspecified osteoarthritis, unspecified site: M19.90

## 2017-01-11 HISTORY — DX: Fibromyalgia: M79.7

## 2017-01-24 NOTE — Progress Notes (Addendum)
Office Visit Note  Patient: [name]             Date of Birth: June 12, 1975           MRN: [id number]             PCP: Enid Skeens., MD Referring: Enid Skeens., MD Visit Date: 02/05/2017 Occupation: '@GUAROCC' @    Subjective:  Pain of the Lower Back; New Patient (Initial Visit); and Joint Pain (hands and feet are painful)   History of Present Illness: [name] is a 41 y.o. female seen in consultation per request of her PCP. According to patient her symptoms a started several years ago with intermittent pain in her hands and lower back. She states she's had increased pain in her bilateral hands since 2015. She had an episode in 2016 with increased lower back pain when she went to urgent care. 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 He referred her to a neurologist. The neurologist evaluated her and advised her to follow up with PCP. At the time she was having intermittent hand swelling and discomfort. She recalls that PCP did x-rays of her hands and labwork which was positive for ANA. She was referred to Dr. Charlestine Night. In 2017 Dr. Charlestine Night diagnosed her with fibromyalgia syndrome and prescribed tramadol. Patient reports she had no response to dermatology discontinued the medication. She went back for follow-up visit with Dr. Charlestine Night for increased feet discomfort and he felt that her symptoms were consistent with fibromyalgia syndrome. Patient reports that she had no relief so far and she went back to PCP who referred her here. She reports ongoing pain and discomfort in her hands and feet and intermittent swelling. Lower back is a still painful.   Activities of Daily Living:  Patient reports morning stiffness for 1 hour.   Patient Reports nocturnal pain. feet Difficulty dressing/grooming: Denies Difficulty climbing stairs: Denies Difficulty getting out of chair: Denies Difficulty using hands for taps, buttons,  cutlery, and/or writing: Denies   Review of Systems  Constitutional: Positive for fatigue. Negative for night sweats, weight gain, weight loss and weakness.  HENT: Positive for mouth dryness. Negative for mouth sores, trouble swallowing, trouble swallowing and nose dryness.   Eyes: Positive for dryness. Negative for pain, redness and visual disturbance.  Respiratory: Negative for cough, shortness of breath and difficulty breathing.   Cardiovascular: Negative for chest pain, palpitations, hypertension, irregular heartbeat and swelling in legs/feet.  Gastrointestinal: Positive for constipation. Negative for blood in stool and diarrhea.       H/o IBS  Endocrine: Positive for increased urination.       H/o IC  Genitourinary: Negative for vaginal dryness.  Musculoskeletal: Positive for arthralgias, joint pain, joint swelling, myalgias, morning stiffness and myalgias. Negative for muscle weakness and muscle tenderness.  Skin: Positive for sensitivity to sunlight. Negative for color change, rash, hair loss, skin tightness and ulcers.  Allergic/Immunologic: Negative for susceptible to infections.  Neurological: Negative for dizziness, memory loss and night sweats.  Hematological: Negative for swollen glands.  Psychiatric/Behavioral: Positive for depressed mood and sleep disturbance. The patient is nervous/anxious.     PMFS History:  Patient Active Problem List   Diagnosis Date Noted  . DDD (degenerative disc disease), lumbar/ facet arthritis  02/05/2017  . History of fibromyalgia 02/05/2017  . History of migraine 02/05/2017  . History of cholecystectomy 02/05/2017  . History of colon polyps  02/05/2017  . History of cystitis (IC) 02/05/2017  . History of gastroesophageal reflux (GERD) 02/05/2017  . History of IBS 02/05/2017  . History of anxiety 02/05/2017  . Family history of Crohn's disease 02/05/2017  . Chronic SI joint pain 02/05/2017    Past Medical History:  Diagnosis Date  .  Headache(784.0)    Migraines    No family history on file. Past Surgical History:  Procedure Laterality Date  . ABDOMINAL HYSTERECTOMY  2010  . APPENDECTOMY  7/12  . cesearean   '96 '00  . DIAGNOSTIC LAPAROSCOPY  2001  . LAPAROSCOPY  04/26/2011   Procedure: LAPAROSCOPY OPERATIVE;  Surgeon: Shon Millet II;  Location: [facility];  Service: Gynecology;  Laterality: N/A;  ablation of endometriosis  . SALPINGOOPHORECTOMY  04/26/2011   Procedure: SALPINGO OOPHERECTOMY;  Surgeon: Shon Millet II;  Location: [facility];  Service: Gynecology;  Laterality: Left;   Social History   Social History Narrative  . No narrative on file     Objective: Vital Signs: BP 124/74 (BP Location: Left Arm)   Pulse 74   Resp 16   Ht '5\' 6"'  (1.676 m)   Wt 214 lb (97.1 kg)   BMI 34.54 kg/m    Physical Exam  Constitutional: She is oriented to person, place, and time. She appears well-developed and well-nourished.  HENT:  Head: Normocephalic and atraumatic.  Eyes: Conjunctivae and EOM are normal.  Neck: Normal range of motion.  Cardiovascular: Normal rate, regular rhythm, normal heart sounds and intact distal pulses.   Pulmonary/Chest: Effort normal and breath sounds normal.  Abdominal: Soft. Bowel sounds are normal.  Lymphadenopathy:    She has no cervical adenopathy.  Neurological: She is alert and oriented to person, place, and time.  Skin: Skin is warm and dry. Capillary refill takes less than 2 seconds.  Psychiatric: She has a normal mood and affect. Her behavior is normal.  Nursing note and vitals reviewed.    Musculoskeletal Exam: C-spine and thoracic lumbar spine good range of motion. She is some tenderness in the SI joint and gluteal region. Shoulder joints elbow joints wrist joints MCPs PIPs DIPs with good range of motion with no synovitis. She has some DIP PIP prominence in her hands. Hip joints knee joints ankles MTPs PIPs with good range of motion. She has some prominence of PIP/DIP's in  her feet. She also has tenderness across her MTPs and on her heel. Fibromyalgia tender points only 6 out of 18 positive. She is some tenderness over bilateral trochanteric bursa.  CDAI Exam: No CDAI exam completed.    Investigation: Findings:  04/05/15 Uric Acid 5.7 RF normal, CRP 8.8, ANA positive no titer , dsDNA negative, RNP negative, Smith Ab negative, Ro negative, La negative, RNP negative, Anti Jo 1 negative    Imaging: Xr Foot 2 Views Left  Result Date: 02/05/2017 No MTP PIP/DIP narrowing was noted. No intertarsal joint space narrowing was noted. Small calcaneal spur was noted  Xr Foot 2 Views Right  Result Date: 02/05/2017 No MTP PIP/DIP narrowing was noted. No intertarsal joint space narrowing was noted. Small calcaneal spur was noted.  Xr Hand 2 View Left  Result Date: 02/05/2017 Minimal PIP narrowing. No MCP or DIP narrowing noted. Minimal CMC narrowing noted. No intercarpal joint space narrowing. Impression x-rays are consistent with mild osteoarthritic changes.  Xr Hand 2 View Right  Result Date: 02/05/2017 Minimal PIP narrowing. No MCP or DIP narrowing noted. Minimal CMC narrowing noted. No intercarpal joint space  narrowing. Impression x-rays are consistent with mild osteoarthritic changes  Xr Pelvis 1-2 Views  Result Date: 02/05/2017 No SI joint sclerosis or narrowing was noted.   Speciality Comments: No specialty comments available.    Procedures:  No procedures performed Allergies: Patient has no known allergies.   Assessment / Plan:     Visit Diagnoses: ANA positive in 2016. No titer given. RF is negative. She has ongoing pain and discomfort in her hands I do not see any synovitis on examination today.  Elevated C-reactive protein (CRP) in the past.  Bilateral hand pain -her clinical findings are consistent with mild osteoarthritis with DIP PIP thickening. I do not see any synovitis today. Plan: XR Hand 2 View Right, XR Hand 2 View Left  Pain in  both feet . She has some clinical features of osteoarthritis with DIP PIP prominence. She's chronic pain in her feet. I did not appreciate any synovitis.- Plan: XR Foot 2 Views Right, XR Foot 2 Views Left  Chronic SI joint pain. She has significant discomfort on palpation over bilateral SI joints. - Plan: XR Pelvis 1-2 Views  DDD (degenerative disc disease), lumbar/ facet arthritis : Patient had history of orthopedic and neurological evaluation in the past.  History of fibromyalgia: She gives history of generalized pain and discomfort and fibromyalgia.  History of migraine - on Topomax  History of cholecystectomy  History of colon polyps  History of cystitis (IC): She has history of interstitial cystitis with frequent seizures urination.  History of gastroesophageal reflux (GERD)  History of IBS: She has alternating diarrhea and constipation. She states she gets colonoscopy every 3 years.  Positive family history of Crohn's disease in her father and grandmother.  History of anxiety - on Xanax    Orders: Orders Placed This Encounter  Procedures  . XR Hand 2 View Right  . XR Hand 2 View Left  . XR Pelvis 1-2 Views  . XR Foot 2 Views Right  . XR Foot 2 Views Left  . CBC with Differential/Platelet  . COMPLETE METABOLIC PANEL WITH GFR  . CK  . TSH  . VITAMIN D 25 Hydroxy (Vit-D Deficiency, Fractures)  . ANA  . Cyclic citrul peptide antibody, IgG  . 14-3-3 eta Protein  . HLA-B27 antigen  . Sedimentation rate   No orders of the defined types were placed in this encounter.   Face-to-face time spent with patient was 50 minutes. Greater than 50% of time was spent in counseling and coordination of care.  Follow-Up Instructions: Return for Polyarthralgia, fibromyalgia.   Bo Merino, MD  Note - This record has been created using Editor, commissioning.  Chart creation errors have been sought, but may not always  have been located. Such creation errors do not reflect on  the  standard of medical care.

## 2017-02-05 ENCOUNTER — Ambulatory Visit (INDEPENDENT_AMBULATORY_CARE_PROVIDER_SITE_OTHER): Payer: No Typology Code available for payment source

## 2017-02-05 ENCOUNTER — Ambulatory Visit (INDEPENDENT_AMBULATORY_CARE_PROVIDER_SITE_OTHER): Payer: Self-pay

## 2017-02-05 ENCOUNTER — Ambulatory Visit (INDEPENDENT_AMBULATORY_CARE_PROVIDER_SITE_OTHER): Payer: No Typology Code available for payment source | Admitting: Rheumatology

## 2017-02-05 ENCOUNTER — Encounter: Payer: Self-pay | Admitting: Rheumatology

## 2017-02-05 VITALS — BP 124/74 | HR 74 | Resp 16 | Ht 66.0 in | Wt 214.0 lb

## 2017-02-05 DIAGNOSIS — M5136 Other intervertebral disc degeneration, lumbar region: Secondary | ICD-10-CM | POA: Diagnosis not present

## 2017-02-05 DIAGNOSIS — Z8719 Personal history of other diseases of the digestive system: Secondary | ICD-10-CM

## 2017-02-05 DIAGNOSIS — R7982 Elevated C-reactive protein (CRP): Secondary | ICD-10-CM

## 2017-02-05 DIAGNOSIS — Z8379 Family history of other diseases of the digestive system: Secondary | ICD-10-CM

## 2017-02-05 DIAGNOSIS — Z9049 Acquired absence of other specified parts of digestive tract: Secondary | ICD-10-CM

## 2017-02-05 DIAGNOSIS — R768 Other specified abnormal immunological findings in serum: Secondary | ICD-10-CM

## 2017-02-05 DIAGNOSIS — Z87440 Personal history of urinary (tract) infections: Secondary | ICD-10-CM | POA: Diagnosis not present

## 2017-02-05 DIAGNOSIS — Z8659 Personal history of other mental and behavioral disorders: Secondary | ICD-10-CM

## 2017-02-05 DIAGNOSIS — Z860100 Personal history of colon polyps, unspecified: Secondary | ICD-10-CM

## 2017-02-05 DIAGNOSIS — Z8739 Personal history of other diseases of the musculoskeletal system and connective tissue: Secondary | ICD-10-CM

## 2017-02-05 DIAGNOSIS — G8929 Other chronic pain: Secondary | ICD-10-CM

## 2017-02-05 DIAGNOSIS — M79641 Pain in right hand: Secondary | ICD-10-CM | POA: Diagnosis not present

## 2017-02-05 DIAGNOSIS — M79671 Pain in right foot: Secondary | ICD-10-CM | POA: Diagnosis not present

## 2017-02-05 DIAGNOSIS — R5383 Other fatigue: Secondary | ICD-10-CM

## 2017-02-05 DIAGNOSIS — Z8669 Personal history of other diseases of the nervous system and sense organs: Secondary | ICD-10-CM

## 2017-02-05 DIAGNOSIS — M79642 Pain in left hand: Secondary | ICD-10-CM

## 2017-02-05 DIAGNOSIS — M79672 Pain in left foot: Secondary | ICD-10-CM

## 2017-02-05 DIAGNOSIS — M533 Sacrococcygeal disorders, not elsewhere classified: Secondary | ICD-10-CM

## 2017-02-05 DIAGNOSIS — M51369 Other intervertebral disc degeneration, lumbar region without mention of lumbar back pain or lower extremity pain: Secondary | ICD-10-CM

## 2017-02-05 HISTORY — DX: Family history of other diseases of the digestive system: Z83.79

## 2017-02-05 HISTORY — DX: Personal history of colonic polyps: Z86.010

## 2017-02-05 HISTORY — DX: Personal history of other diseases of the musculoskeletal system and connective tissue: Z87.39

## 2017-02-05 HISTORY — DX: Personal history of other mental and behavioral disorders: Z86.59

## 2017-02-05 HISTORY — DX: Other intervertebral disc degeneration, lumbar region: M51.36

## 2017-02-05 HISTORY — DX: Personal history of other diseases of the nervous system and sense organs: Z86.69

## 2017-02-05 HISTORY — DX: Other intervertebral disc degeneration, lumbar region without mention of lumbar back pain or lower extremity pain: M51.369

## 2017-02-05 HISTORY — DX: Personal history of colon polyps, unspecified: Z86.0100

## 2017-02-05 HISTORY — DX: Sacrococcygeal disorders, not elsewhere classified: M53.3

## 2017-02-05 HISTORY — DX: Acquired absence of other specified parts of digestive tract: Z90.49

## 2017-02-05 HISTORY — DX: Other chronic pain: G89.29

## 2017-02-05 HISTORY — DX: Personal history of other diseases of the digestive system: Z87.19

## 2017-02-05 HISTORY — DX: Personal history of urinary (tract) infections: Z87.440

## 2017-02-06 ENCOUNTER — Telehealth: Payer: Self-pay | Admitting: *Deleted

## 2017-02-06 MED ORDER — VITAMIN D (ERGOCALCIFEROL) 1.25 MG (50000 UNIT) PO CAPS: 50000.0000 [IU] | ORAL_CAPSULE | ORAL | 0 refills | Status: DC

## 2017-02-06 NOTE — Progress Notes (Signed)
Call and vitamin D 50,000 units twice a week for 3 months. Repeat vitamin D in 3 months.

## 2017-02-06 NOTE — Telephone Encounter (Signed)
-----   Message from Pollyann Savoy, MD sent at 02/06/2017  1:00 PM EDT ----- Call and vitamin D 50,000 units twice a week for 3 months. Repeat vitamin D in 3 months.

## 2017-02-09 LAB — CK: Total CK: 42 U/L (ref 29–143)

## 2017-02-09 LAB — COMPLETE METABOLIC PANEL WITH GFR
AG Ratio: 1.3 (calc) (ref 1.0–2.5)
ALBUMIN MSPROF: 3.9 g/dL (ref 3.6–5.1)
ALT: 14 U/L (ref 6–29)
AST: 15 U/L (ref 10–30)
Alkaline phosphatase (APISO): 72 U/L (ref 33–115)
BUN/Creatinine Ratio: 12 (calc) (ref 6–22)
BUN: 13 mg/dL (ref 7–25)
CO2: 22 mmol/L (ref 20–32)
CREATININE: 1.11 mg/dL — AB (ref 0.50–1.10)
Calcium: 9.3 mg/dL (ref 8.6–10.2)
Chloride: 109 mmol/L (ref 98–110)
GFR, EST AFRICAN AMERICAN: 71 mL/min/{1.73_m2} (ref >=60)
GFR, EST NON AFRICAN AMERICAN: 62 mL/min/{1.73_m2} (ref >=60)
GLUCOSE: 81 mg/dL (ref 65–99)
Globulin: 3 g/dL (calc) (ref 1.9–3.7)
Potassium: 4.4 mmol/L (ref 3.5–5.3)
SODIUM: 139 mmol/L (ref 135–146)
TOTAL PROTEIN: 6.9 g/dL (ref 6.1–8.1)
Total Bilirubin: 0.3 mg/dL (ref 0.2–1.2)

## 2017-02-09 LAB — CBC WITH DIFFERENTIAL/PLATELET
BASOS PCT: 1 %
Basophils Absolute: 100 cells/uL (ref 0–200)
EOS PCT: 1.9 %
Eosinophils Absolute: 190 cells/uL (ref 15–500)
HCT: 38.3 % (ref 35.0–45.0)
HEMOGLOBIN: 12.8 g/dL (ref 11.7–15.5)
Lymphs Abs: 3740 cells/uL (ref 850–3900)
MCH: 30.3 pg (ref 27.0–33.0)
MCHC: 33.4 g/dL (ref 32.0–36.0)
MCV: 90.5 fL (ref 80.0–100.0)
MONOS PCT: 6.8 %
MPV: 11.7 fL (ref 7.5–12.5)
NEUTROS ABS: 5290 {cells}/uL (ref 1500–7800)
Neutrophils Relative %: 52.9 %
Platelets: 286 10*3/uL (ref 140–400)
RBC: 4.23 10*6/uL (ref 3.80–5.10)
RDW: 12.7 % (ref 11.0–15.0)
Total Lymphocyte: 37.4 %
WBC mixed population: 680 cells/uL (ref 200–950)
WBC: 10 10*3/uL (ref 3.8–10.8)

## 2017-02-09 LAB — TSH: TSH: 0.75 mIU/L

## 2017-02-09 LAB — VITAMIN D 25 HYDROXY (VIT D DEFICIENCY, FRACTURES): Vit D, 25-Hydroxy: 14 ng/mL — ABNORMAL LOW (ref 30–100)

## 2017-02-09 LAB — CYCLIC CITRUL PEPTIDE ANTIBODY, IGG

## 2017-02-09 LAB — SEDIMENTATION RATE: Sed Rate: 38 mm/h — ABNORMAL HIGH (ref 0–20)

## 2017-02-09 LAB — HLA-B27 ANTIGEN: HLA-B27 ANTIGEN: NEGATIVE

## 2017-02-09 LAB — ANA: Anti Nuclear Antibody(ANA): NEGATIVE

## 2017-02-09 LAB — 14-3-3 ETA PROTEIN: 14-3-3 eta Protein: <0.2 ng/mL (ref <0.2)

## 2017-03-01 NOTE — Progress Notes (Signed)
Office Visit Note  Patient: [name]             Date of Birth: 07-30-1975           MRN: [id number]             PCP: Enid Skeens., MD Referring: No ref. provider found Visit Date: 03/05/2017 Occupation: '@GUAROCC' @    Subjective:  Pain in hand , feet and lower back.   History of Present Illness: [name] is a 41 y.o. female with history of osteoarthritis disc disease and fibromyalgia. She states she's been having pain and discomfort in multiple joints including her bilateral hands her bilateral feet and her lower back. Her lower back and feet has been most painful. She notices swelling off and on in her hands and knees.  Activities of Daily Living:  Patient reports morning stiffness for 1 hour.   Patient Reports nocturnal pain.  Difficulty dressing/grooming: Denies Difficulty climbing stairs: Denies Difficulty getting out of chair: Denies Difficulty using hands for taps, buttons, cutlery, and/or writing: Denies   Review of Systems  Constitutional: Positive for fatigue. Negative for night sweats, weight gain, weight loss and weakness.  HENT: Negative for mouth sores, trouble swallowing, trouble swallowing, mouth dryness and nose dryness.   Eyes: Negative for pain, redness, visual disturbance and dryness.  Respiratory: Negative for cough, shortness of breath and difficulty breathing.   Cardiovascular: Negative for chest pain, palpitations, hypertension, irregular heartbeat and swelling in legs/feet.  Gastrointestinal: Negative for blood in stool, constipation and diarrhea.  Endocrine: Negative for increased urination.  Genitourinary: Negative for vaginal dryness.  Musculoskeletal: Positive for arthralgias, joint pain, myalgias, morning stiffness and myalgias. Negative for joint swelling, muscle weakness and muscle tenderness.  Skin: Negative for color change, rash, hair loss, skin tightness, ulcers and sensitivity to sunlight.  Allergic/Immunologic: Negative for  susceptible to infections.  Neurological: Positive for headaches. Negative for dizziness, memory loss and night sweats.  Hematological: Negative for swollen glands.  Psychiatric/Behavioral: Negative for depressed mood and sleep disturbance. The patient is nervous/anxious.     PMFS History:  Patient Active Problem List   Diagnosis Date Noted  . DDD (degenerative disc disease), lumbar/ facet arthritis  02/05/2017  . History of fibromyalgia 02/05/2017  . History of migraine 02/05/2017  . History of cholecystectomy 02/05/2017  . History of colon polyps 02/05/2017  . History of cystitis (IC) 02/05/2017  . History of gastroesophageal reflux (GERD) 02/05/2017  . History of IBS 02/05/2017  . History of anxiety 02/05/2017  . Family history of Crohn's disease 02/05/2017  . Chronic SI joint pain 02/05/2017    Past Medical History:  Diagnosis Date  . Headache(784.0)    Migraines    No family history on file. Past Surgical History:  Procedure Laterality Date  . ABDOMINAL HYSTERECTOMY  2010  . APPENDECTOMY  7/12  . cesearean   '96 '00  . DIAGNOSTIC LAPAROSCOPY  2001  . LAPAROSCOPY  04/26/2011   Procedure: LAPAROSCOPY OPERATIVE;  Surgeon: Shon Millet II;  Location: [facility];  Service: Gynecology;  Laterality: N/A;  ablation of endometriosis  . SALPINGOOPHORECTOMY  04/26/2011   Procedure: SALPINGO OOPHERECTOMY;  Surgeon: Shon Millet II;  Location: [facility];  Service: Gynecology;  Laterality: Left;   Social History   Social History Narrative  . No narrative on file     Objective: Vital Signs: BP 111/85 (BP Location: Left Arm, Patient Position: Sitting, Cuff Size: Large)   Pulse 98  Resp 12   Ht '5\' 6"'  (1.676 m)   Wt 217 lb (98.4 kg)   BMI 35.02 kg/m    Physical Exam  Constitutional: She is oriented to person, place, and time. She appears well-developed and well-nourished.  HENT:  Head: Normocephalic and atraumatic.  Eyes: Conjunctivae and EOM are normal.  Neck: Normal  range of motion.  Cardiovascular: Normal rate, regular rhythm, normal heart sounds and intact distal pulses.   Pulmonary/Chest: Effort normal and breath sounds normal.  Abdominal: Soft. Bowel sounds are normal.  Lymphadenopathy:    She has no cervical adenopathy.  Neurological: She is alert and oriented to person, place, and time.  Skin: Skin is warm and dry. Capillary refill takes less than 2 seconds.  Psychiatric: She has a normal mood and affect. Her behavior is normal.  Nursing note and vitals reviewed.    Musculoskeletal Exam: C-spine and thoracic spine good range of motion. She has discomfort range of motion of her lumbar spine. Shoulder joints although joints wrist joints MCPs PIPs DIPs with good range of motion. She is some thickening of PIP/DIP joints with no synovitis. Hip joints knee joints ankles MTPs PIPs DIPs are good range of motion with no synovitis. Fibromyalgia tender points were 8 out of 18 positive. She also had generalized hyperalgesia.  CDAI Exam: No CDAI exam completed.    Investigation: Findings:  04/05/15 Uric Acid 5.7 RF normal, CRP 8.8, ANA positive no titer , dsDNA negative, RNP negative, Smith Ab negative, Ro negative, La negative, RNP negative, Anti Jo 1 negative  02/05/2017 CBC normal, CMP creatinine 1.11 GFR 62, CK 42, TSH normal, vitamin D 14 ESR 38, ANA negative, anti-CCP negative, 14 33 eta negative, HLA-B27 negative  Imaging: Xr Foot 2 Views Left  Result Date: 02/05/2017 No MTP PIP/DIP narrowing was noted. No intertarsal joint space narrowing was noted. Small calcaneal spur was noted  Xr Foot 2 Views Right  Result Date: 02/05/2017 No MTP PIP/DIP narrowing was noted. No intertarsal joint space narrowing was noted. Small calcaneal spur was noted.  Xr Hand 2 View Left  Result Date: 02/05/2017 Minimal PIP narrowing. No MCP or DIP narrowing noted. Minimal CMC narrowing noted. No intercarpal joint space narrowing. Impression x-rays are consistent with  mild osteoarthritic changes.  Xr Hand 2 View Right  Result Date: 02/05/2017 Minimal PIP narrowing. No MCP or DIP narrowing noted. Minimal CMC narrowing noted. No intercarpal joint space narrowing. Impression x-rays are consistent with mild osteoarthritic changes  Xr Pelvis 1-2 Views  Result Date: 02/05/2017 No SI joint sclerosis or narrowing was noted.   Speciality Comments: No specialty comments available.    Procedures:  No procedures performed Allergies: Patient has no known allergies.   Assessment / Plan:     Visit Diagnoses: Primary osteoarthritis of both hands - ANA negative, RF negative, anti-CCP negative, 1433 eta negative. All autoimmune workup was negative. She has no synovitis on examination. Her findings are consistent with osteoarthritis. Joint protection and muscle strengthening was discussed.  DDD (degenerative disc disease), lumbar: Chronic pain. Weight loss diet and exercise was discussed.  Pain in both feet: She's been having discomfort in her feet as well. She's no synovitis on examination.  Fibromyalgia: She has generalized pain fatigue and positive tender points. She may benefit from Cymbalta. Have advised her to discuss that further with her PCP.  Vitamin D deficiency: Her vitamin D was very low. She was given vitamin D 50,000 units twice a week for 3 months. We will check her  labs after 3 months she may need long-term vitamin D supplement.  Elevated serum creatinine : She reports that she takes NSAIDs on regular basis. I have discouraged the use of NSAIDs. She was advised to take Tylenol instead.  Her other medical problems are listed as follows.  History of migraine - on Topomax  History of cholecystectomy  History of colon polyps  History of cystitis (IC): She has history of interstitial cystitis with frequent seizures urination.  History of gastroesophageal reflux (GERD)  History of IBS: She has alternating diarrhea and constipation. She  states she gets colonoscopy every 3 years.  Positive family history of Crohn's disease in her father and grandmother.  History of anxiety - on Xanax  Orders: No orders of the defined types were placed in this encounter.  No orders of the defined types were placed in this encounter.   Follow-Up Instructions: Return if symptoms worsen or fail to improve, for Osteoarthritis,FMS.   Bo Merino, MD  Note - This record has been created using Editor, commissioning.  Chart creation errors have been sought, but may not always  have been located. Such creation errors do not reflect on  the standard of medical care.

## 2017-03-05 ENCOUNTER — Encounter: Payer: Self-pay | Admitting: Rheumatology

## 2017-03-05 ENCOUNTER — Ambulatory Visit (INDEPENDENT_AMBULATORY_CARE_PROVIDER_SITE_OTHER): Payer: No Typology Code available for payment source | Admitting: Rheumatology

## 2017-03-05 VITALS — BP 111/85 | HR 98 | Resp 12 | Ht 66.0 in | Wt 217.0 lb

## 2017-03-05 DIAGNOSIS — M797 Fibromyalgia: Secondary | ICD-10-CM

## 2017-03-05 DIAGNOSIS — M5136 Other intervertebral disc degeneration, lumbar region: Secondary | ICD-10-CM

## 2017-03-05 DIAGNOSIS — M79672 Pain in left foot: Secondary | ICD-10-CM

## 2017-03-05 DIAGNOSIS — R7989 Other specified abnormal findings of blood chemistry: Secondary | ICD-10-CM | POA: Diagnosis not present

## 2017-03-05 DIAGNOSIS — M79671 Pain in right foot: Secondary | ICD-10-CM | POA: Diagnosis not present

## 2017-03-05 DIAGNOSIS — E559 Vitamin D deficiency, unspecified: Secondary | ICD-10-CM

## 2017-03-05 DIAGNOSIS — M19042 Primary osteoarthritis, left hand: Secondary | ICD-10-CM

## 2017-03-05 DIAGNOSIS — M19041 Primary osteoarthritis, right hand: Secondary | ICD-10-CM

## 2017-03-19 ENCOUNTER — Telehealth: Payer: Self-pay

## 2017-03-19 NOTE — Telephone Encounter (Signed)
Labs faxed

## 2017-03-19 NOTE — Telephone Encounter (Signed)
[name] with Dr. Molinda BailiffSlatosky's office would like for lab result to be faxed to [phone].  Please advise.  Thank You.

## 2017-04-28 ENCOUNTER — Other Ambulatory Visit: Payer: Self-pay | Admitting: Rheumatology

## 2017-04-28 NOTE — Telephone Encounter (Signed)
Left message to advise patient she will need Vitamin D rechecked before medication can be refilled.

## 2017-08-04 ENCOUNTER — Encounter: Payer: Self-pay | Admitting: Gastroenterology

## 2017-08-05 ENCOUNTER — Ambulatory Visit (INDEPENDENT_AMBULATORY_CARE_PROVIDER_SITE_OTHER): Payer: No Typology Code available for payment source | Admitting: Gastroenterology

## 2017-08-05 ENCOUNTER — Encounter: Payer: Self-pay | Admitting: Gastroenterology

## 2017-08-05 VITALS — HR 86 | Ht 67.0 in | Wt 221.2 lb

## 2017-08-05 DIAGNOSIS — R1013 Epigastric pain: Secondary | ICD-10-CM

## 2017-08-05 DIAGNOSIS — K219 Gastro-esophageal reflux disease without esophagitis: Secondary | ICD-10-CM | POA: Diagnosis not present

## 2017-08-05 DIAGNOSIS — K589 Irritable bowel syndrome without diarrhea: Secondary | ICD-10-CM

## 2017-08-05 DIAGNOSIS — Z860100 Personal history of colon polyps, unspecified: Secondary | ICD-10-CM

## 2017-08-05 MED ORDER — SOD PICOSULFATE-MAG OX-CIT ACD 10-3.5-12 MG-GM -GM/160ML PO SOLN: 1.0000 | Freq: Once | ORAL | 0 refills | Status: AC

## 2017-08-05 NOTE — Progress Notes (Signed)
IMPRESSION and PLAN:    #1.  Epigastric pain ( D/d includes gastritis, esophagitis, peptic ulcer disease, nonulcer dyspepsia, hiatal hernia, gastroparesis, musculoskeletal etiology, pancreatic or biliary etiology.) Proceed with the EGD. Continue omeprazole 40 mg p.o. once a day for now. If still with problems, would recommend ultrasound of the abdomen possibly followed by a HIDA scan ejection fraction. Minimize use of Reglan as she is currently doing.  Discussed potential for extrapyramidal side effects.  #2.  Gastroesophageal reflux. -Same as above.  #3.  History of tubular adenomas. Proceed with colonoscopy. Proceed with colonoscopy.  I have explained the risks and benefits in detail including small risks of perforation requiring surgery, bleeding requiring blood transfusions and small but definite risk of missing colorectal neoplasms.  Risks of anesthesia were also explained.  The benefits were also explained.  Patient agrees to proceed.  She will be given a 2-day preparation.  #4.  IBS with predominant constipation. Continue MiraLAX for now. Increase water intake. High-fiber diet. If still with problems, will give a trial of Amitiza, Linzess or Trulance.  Patient is to call us if still with problems.  All the contact numbers were given.   HPI:    Chief Complaint:    HPI:  Patient is a very pleasant 42 year old white female here for colonoscopy for previous history of adenomatous polyps.  She has been having occasional epigastric discomfort with abdominal bloating and occasional heartburn.  She has been on omeprazole without any significant relief.  She has been taking omeprazole for the last 2-3 years.  No nausea, vomiting, regurgitation, odynophagia or dysphagia.  No significant diarrhea or constipation.  There is no melena or hematochezia. No unintentional weight loss.      Review of Systems:  Constitutional: Denies fever, chills, diaphoresis, appetite change and  fatigue.  HEENT: Denies photophobia, eye pain, redness, hearing loss, ear pain, congestion, sore throat, rhinorrhea, sneezing, mouth sores, neck pain, neck stiffness and tinnitus.   Respiratory: Denies SOB, DOE, cough, chest tightness,  and wheezing.   Cardiovascular: Denies chest pain, palpitations and leg swelling.  Genitourinary: Denies dysuria, urgency, frequency, hematuria, flank pain and difficulty urinating.  Musculoskeletal: Denies myalgias, back pain, joint swelling, arthralgias and gait problem.  Skin: No rash. .  Neurological: Denies dizziness, seizures, syncope, weakness, light-headedness, numbness and headaches.  Hematological: Denies adenopathy. Easy bruising, personal or family bleeding history  Psychiatric/Behavioral: No anxiety or depression     Allergies: NKDA     Past Medical History:  Diagnosis Date  . Fibromyalgia 01/2017  . Headache(784.0)    Migraines  . Osteoarthritis 01/2017   Past Surgical History:  Procedure Laterality Date  . ABDOMINAL HYSTERECTOMY  2010  . APPENDECTOMY  7/12  . cesearean   '96 '00  . DIAGNOSTIC LAPAROSCOPY  2001  . LAPAROSCOPY  04/26/2011   Procedure: LAPAROSCOPY OPERATIVE;  Surgeon: Roselle Locus II;  Location: [facility];  Service: Gynecology;  Laterality: N/A;  ablation of endometriosis  . SALPINGOOPHORECTOMY  04/26/2011   Procedure: SALPINGO OOPHERECTOMY;  Surgeon: Roselle Locus II;  Location: [facility];  Service: Gynecology;  Laterality: Left;   Family History  Problem Relation Age of Onset  . Lung cancer Father 64  . Brain cancer Father 4  . High blood pressure Mother   . Diabetes Mother   . Lupus Maternal Grandmother   . Lymphoma Maternal Grandmother   . Heart disease Maternal Grandfather   . Diabetes Maternal Grandfather     Current Outpatient  Medications  Medication Sig Dispense Refill  . albuterol (VENTOLIN HFA) 108 (90 Base) MCG/ACT inhaler Inhale into the lungs every 6 (six) hours as needed for wheezing or  shortness of breath.    . ALPRAZolam (XANAX) 0.5 MG tablet Take 0.5 mg by mouth at bedtime as needed for anxiety.    . cyclobenzaprine (FLEXERIL) 10 MG tablet Take 10-20 mg by mouth at bedtime.    Marland Kitchen. ibuprofen (ADVIL,MOTRIN) 200 MG tablet Take 200 mg by mouth every 6 (six) hours as needed.    . metoCLOPramide (REGLAN) 10 MG tablet Take 10 mg by mouth 4 (four) times daily.    Marland Kitchen. omeprazole (PRILOSEC) 40 MG capsule Take 40 mg by mouth daily.    Marland Kitchen. topiramate (TOPAMAX) 25 MG tablet TAKE 2 TABLETS TWICE A DAY (Patient taking differently: take 3 bid) 120 tablet 0  . Vitamin D, Ergocalciferol, (DRISDOL) 50000 units CAPS capsule Take 1 capsule (50,000 Units total) by mouth 2 (two) times a week. (Patient not taking: Reported on 08/05/2017) 24 capsule 0   No current facility-administered medications for this visit.      Physical Exam:     Pulse 86   Ht 5\' 7"  (1.702 m)   Wt 221 lb 4 oz (100.4 kg)   SpO2 97%   BMI 34.65 kg/m   GENERAL:  Not in NAD PSYCH: :Pleasant, cooperative, normal affect EENT:  conjunctiva pink, mucous membranes moist, neck supple without masses CARDIAC:  RRR, no murmur heard, + peripheral edema PULM: Normal respiratory effort, lungs CTA bilaterally, no wheezing ABDOMEN:  Nondistended, soft, nontender. No obvious masses, no hepatomegaly,  normal bowel sounds SKIN:  turgor, no lesions seen Musculoskeletal:  Normal muscle tone, normal strength NEURO: Alert and oriented x 3, no focal neurologic deficits   08/05/2017, 12:00 PM

## 2017-08-05 NOTE — Patient Instructions (Addendum)
If you are age 42 or older, your body mass index should be between 23-30. Your Body mass index is 34.65 kg/m. If this is out of the aforementioned range listed, please consider follow up with your Primary Care Provider.  If you are age 164 or younger, your body mass index should be between 19-25. Your Body mass index is 34.65 kg/m. If this is out of the aformentioned range listed, please consider follow up with your Primary Care Provider.   We have sent the following medications to your pharmacy for you to pick up at your convenience: ClenPiq  You have been scheduled for a colonoscopy. Please follow written instructions given to you at your visit today.  Please pick up your prep supplies at the pharmacy within the next 1-3 days. If you use inhalers (even only as needed), please bring them with you on the day of your procedure. Your physician has requested that you go to www.startemmi.com and enter the access code given to you at your visit today. This web site gives a general overview about your procedure. However, you should still follow specific instructions given to you by our office regarding your preparation for the procedure.  Thank you,  Dr. Lynann Bolognaajesh Gupta

## 2017-08-09 ENCOUNTER — Encounter: Payer: Self-pay | Admitting: Gastroenterology

## 2017-08-26 ENCOUNTER — Encounter: Payer: Self-pay | Admitting: Gastroenterology

## 2017-09-01 ENCOUNTER — Other Ambulatory Visit: Payer: Self-pay

## 2017-09-01 ENCOUNTER — Ambulatory Visit (AMBULATORY_SURGERY_CENTER): Payer: No Typology Code available for payment source | Admitting: Gastroenterology

## 2017-09-01 ENCOUNTER — Encounter: Payer: Self-pay | Admitting: Gastroenterology

## 2017-09-01 VITALS — BP 113/81 | HR 86 | Temp 99.3°F | Resp 16 | Ht 67.0 in | Wt 221.0 lb

## 2017-09-01 DIAGNOSIS — R1013 Epigastric pain: Secondary | ICD-10-CM

## 2017-09-01 DIAGNOSIS — K319 Disease of stomach and duodenum, unspecified: Secondary | ICD-10-CM

## 2017-09-01 DIAGNOSIS — Z86010 Personal history of colonic polyps: Secondary | ICD-10-CM

## 2017-09-01 MED ORDER — SODIUM CHLORIDE 0.9 % IV SOLN: 500.0000 mL | Freq: Once | INTRAVENOUS | Status: AC

## 2017-09-01 NOTE — Progress Notes (Signed)
Called to room to assist during endoscopic procedure.  Patient ID and intended procedure confirmed with present staff. Received instructions for my participation in the procedure from the performing physician.  

## 2017-09-01 NOTE — Op Note (Addendum)
[facility] Patient Name: [name] Procedure Date: 09/01/2017 2:51 PM MRN: [id number] Endoscopist: Jackquline Denmark MD, MD Age: 42 Referring MD:  Date of Birth: [birth year] Gender: Female Account #: [id number] Procedure:                Upper GI endoscopy Indications:              Epigastric abdominal pain, Heartburn Medicines:                Monitored Anesthesia Care Procedure:                Pre-Anesthesia Assessment:                           - Prior to the procedure, a History and Physical                            was performed, and patient medications and                            allergies were reviewed. The patient is competent.                            The risks and benefits of the procedure and the                            sedation options and risks were discussed with the                            patient. All questions were answered and informed                            consent was obtained. Patient identification and                            proposed procedure were verified by the physician                            in the procedure room. Mental Status Examination:                            alert and oriented. Prophylactic Antibiotics: The                            patient does not require prophylactic antibiotics.                            Prior Anticoagulants: The patient has taken no                            previous anticoagulant or antiplatelet agents. ASA                            Grade Assessment: II - A patient with mild systemic  disease. After reviewing the risks and benefits,                            the patient was deemed in satisfactory condition to                            undergo the procedure. The anesthesia plan was to                            use monitored anesthesia care (MAC). Immediately                            prior to administration of medications, the patient                            was  re-assessed for adequacy to receive sedatives.                            The heart rate, respiratory rate, oxygen                            saturations, blood pressure, adequacy of pulmonary                            ventilation, and response to care were monitored                            throughout the procedure. The physical status of                            the patient was re-assessed after the procedure.                           After obtaining informed consent, the endoscope was                            passed under direct vision. Throughout the                            procedure, the patient's blood pressure, pulse, and                            oxygen saturations were monitored continuously. The                            Endoscope was introduced through the mouth, and                            advanced to the second part of duodenum. The upper                            GI endoscopy was accomplished without difficulty.  The patient tolerated the procedure well. Scope In: 3:04:36 PM Scope Out: 3:19:16 PM Scope Withdrawal Time: 0 hours 10 minutes 48 seconds  Total Procedure Duration: 0 hours 14 minutes 40 seconds  Findings:                 The esophagus was normal. Z-line was very defined                            at 36 cm. The lower esophagus was also examined by                            narrow band imaging.                           Localized mild inflammation characterized by                            congestion (edema) was found in the gastric antrum.                            Biopsies were taken with a cold forceps for                            Helicobacter pylori testing using CLOtest.                           The examined duodenum was normal. Biopsies for                            histology were taken with a cold forceps for                            evaluation of celiac disease. Complications:            No immediate  complications. Estimated Blood Loss:     Estimated blood loss: none. Impression:               - Minimal gastritis.                           - Otherwise normal EGD. Recommendation:           - Patient has a contact number available for                            emergencies. The signs and symptoms of potential                            delayed complications were discussed with the                            patient. Return to normal activities tomorrow.                            Written discharge instructions were provided to the  patient.                           - Resume previous diet.                           - Continue present medications.                           - Await pathology results.                           - Brochures regarding GERD were given. Stop smoking. Jackquline Denmark MD, MD 09/01/2017 3:26:51 PM This report has been signed electronically.

## 2017-09-01 NOTE — Progress Notes (Signed)
Pt's states no medical or surgical changes since previsit or office visit. 

## 2017-09-01 NOTE — Progress Notes (Signed)
To recovery report to RN, VSS

## 2017-09-01 NOTE — Patient Instructions (Signed)
UPPER ENDOSCOPY- AWAIT BIOPSY RESULTS   INFORMATION ON GERD GIVEN TO YOU TODAY  STOP SMOKING   COLONOSCOPY-INFORMATION ON DIVERTICULOSIS & HEMORRHOIDS GIVEN TO YOU TODAY  REPEAT COLONOSCOPY IN 5 YEARS    YOU HAD AN ENDOSCOPIC PROCEDURE TODAY AT THE [facility]:   Refer to the procedure report that was given to you for any specific questions about what was found during the examination.  If the procedure report does not answer your questions, please call your gastroenterologist to clarify.  If you requested that your care partner not be given the details of your procedure findings, then the procedure report has been included in a sealed envelope for you to review at your convenience later.  YOU SHOULD EXPECT: Some feelings of bloating in the abdomen. Passage of more gas than usual.  Walking can help get rid of the air that was put into your GI tract during the procedure and reduce the bloating. If you had a lower endoscopy (such as a colonoscopy or flexible sigmoidoscopy) you may notice spotting of blood in your stool or on the toilet paper. If you underwent a bowel prep for your procedure, you may not have a normal bowel movement for a few days.  Please Note:  You might notice some irritation and congestion in your nose or some drainage.  This is from the oxygen used during your procedure.  There is no need for concern and it should clear up in a day or so.  SYMPTOMS TO REPORT IMMEDIATELY:   Following lower endoscopy (colonoscopy or flexible sigmoidoscopy):  Excessive amounts of blood in the stool  Significant tenderness or worsening of abdominal pains  Swelling of the abdomen that is new, acute  Fever of 100F or higher   Following upper endoscopy (EGD)  Vomiting of blood or coffee ground material  New chest pain or pain under the shoulder blades  Painful or persistently difficult swallowing  New shortness of breath  Fever of 100F or higher  Black, tarry-looking  stools  For urgent or emergent issues, a gastroenterologist can be reached at any hour by calling [phone].   DIET:  We do recommend a small meal at first, but then you may proceed to your regular diet.  Drink plenty of fluids but you should avoid alcoholic beverages for 24 hours.  ACTIVITY:  You should plan to take it easy for the rest of today and you should NOT DRIVE or use heavy machinery until tomorrow (because of the sedation medicines used during the test).    FOLLOW UP: Our staff will call the number listed on your records the next business day following your procedure to check on you and address any questions or concerns that you may have regarding the information given to you following your procedure. If we do not reach you, we will leave a message.  However, if you are feeling well and you are not experiencing any problems, there is no need to return our call.  We will assume that you have returned to your regular daily activities without incident.  If any biopsies were taken you will be contacted by phone or by letter within the next 1-3 weeks.  Please call us at [phone] if you have not heard about the biopsies in 3 weeks.    SIGNATURES/CONFIDENTIALITY: You and/or your care partner have signed paperwork which will be entered into your electronic medical record.  These signatures attest to the fact that that the information above on  your After Visit Summary has been reviewed and is understood.  Full responsibility of the confidentiality of this discharge information lies with you and/or your care-partner.

## 2017-09-01 NOTE — Op Note (Signed)
[facility] Patient Name: [name] Procedure Date: 09/01/2017 2:51 PM MRN: [id number] Endoscopist: Jackquline Denmark MD, MD Age: 42 Referring MD:  Date of Birth: [birth year] Gender: Female Account #: [id number] Procedure:                Colonoscopy Indications:              High risk colon cancer surveillance: Personal                            history of colonic polyps Medicines:                Monitored Anesthesia Care Procedure:                Pre-Anesthesia Assessment:                           - Prior to the procedure, a History and Physical                            was performed, and patient medications and                            allergies were reviewed. The patient is competent.                            The risks and benefits of the procedure and the                            sedation options and risks were discussed with the                            patient. All questions were answered and informed                            consent was obtained. Patient identification and                            proposed procedure were verified by the physician                            in the procedure room. Mental Status Examination:                            alert and oriented. Prophylactic Antibiotics: The                            patient does not require prophylactic antibiotics.                            Prior Anticoagulants: The patient has taken no                            previous anticoagulant or antiplatelet agents. ASA  Grade Assessment: II - A patient with mild systemic                            disease. After reviewing the risks and benefits,                            the patient was deemed in satisfactory condition to                            undergo the procedure. The anesthesia plan was to                            use monitored anesthesia care (MAC). Immediately                            prior to administration of  medications, the patient                            was re-assessed for adequacy to receive sedatives.                            The heart rate, respiratory rate, oxygen                            saturations, blood pressure, adequacy of pulmonary                            ventilation, and response to care were monitored                            throughout the procedure. The physical status of                            the patient was re-assessed after the procedure.                           After obtaining informed consent, the colonoscope                            was passed under direct vision. Throughout the                            procedure, the patient's blood pressure, pulse, and                            oxygen saturations were monitored continuously. The                            Colonoscope was introduced through the anus and                            advanced to the 3 cm into the ileum. The  colonoscopy was performed without difficulty. The                            patient tolerated the procedure well. The quality                            of the bowel preparation was adequate to identify                            polyps 6 mm and larger in size. Findings:                 Hemorrhoids (Gd I) were found on perianal exam.                           A few rare small diverticula were found in the                            sigmoid colon.                           The exam was otherwise without abnormality. Complications:            No immediate complications. Estimated Blood Loss:     Estimated blood loss: none. Impression:               - Minimal sigmoid diverticulosis in the sigmoid                            colon.                           - Otherwise normal colonoscopy to terminal ileum. Recommendation:           - Patient has a contact number available for                            emergencies. The signs and symptoms of potential                             delayed complications were discussed with the                            patient. Return to normal activities tomorrow.                            Written discharge instructions were provided to the                            patient.                           - Resume previous diet.                           - Continue present medications.                           -  Repeat colonoscopy in 5 years for surveillance,                            since patient had significant tubular adenomas in                            the past at a young age.                           - Return to GI clinic PRN. Jackquline Denmark MD, MD 09/01/2017 3:43:12 PM This report has been signed electronically.

## 2017-09-02 ENCOUNTER — Telehealth: Payer: Self-pay

## 2017-09-02 LAB — HELICOBACTER PYLORI SCREEN-BIOPSY: UREASE: NEGATIVE

## 2017-09-02 NOTE — Addendum Note (Signed)
Addended by: Inocente SallesWESTBROOK, Lekesha Claw B on: 09/02/2017 04:08 PM   Modules accepted: Orders

## 2017-09-02 NOTE — Telephone Encounter (Signed)
  Follow up Call-  Call back number 09/01/2017  Post procedure Call Back phone  # [phone]  Permission to leave phone message Yes  Some recent data might be hidden     Patient questions:  Do you have a fever, pain , or abdominal swelling? No. Pain Score  0 *  Have you tolerated food without any problems? Yes.    Have you been able to return to your normal activities? Yes.    Do you have any questions about your discharge instructions: Diet   No. Medications  No. Follow up visit  No.  Do you have questions or concerns about your Care? No.  Actions: * If pain score is 4 or above: No action needed, pain <4.

## 2017-09-04 ENCOUNTER — Encounter: Payer: Self-pay | Admitting: Gastroenterology

## 2017-09-14 ENCOUNTER — Encounter: Payer: Self-pay | Admitting: Gastroenterology

## 2017-10-08 ENCOUNTER — Telehealth: Payer: Self-pay

## 2017-10-08 ENCOUNTER — Other Ambulatory Visit: Payer: Self-pay

## 2017-10-08 ENCOUNTER — Telehealth: Payer: Self-pay | Admitting: Gastroenterology

## 2017-10-08 DIAGNOSIS — R1013 Epigastric pain: Secondary | ICD-10-CM

## 2017-10-08 NOTE — Telephone Encounter (Signed)
She is requesting an abdominal ultrasound.  Per your office note of 08/05/17, I did order a complete abdominal ultrasound, as you indicated that would be the next step if she was still having symptoms and she is.

## 2017-10-08 NOTE — Telephone Encounter (Signed)
She is scheduled for a complete abdominal ultrasound on 10/13/17 at 9:30 am with arrival at [facility] at 9:15 am.  She is to be NPO after midnight the night before the procedure.

## 2017-10-08 NOTE — Telephone Encounter (Signed)
Thanks for letting me know!

## 2017-10-08 NOTE — Telephone Encounter (Signed)
Lmtcb 5/29  PKW

## 2017-10-09 ENCOUNTER — Telehealth: Payer: Self-pay

## 2017-10-09 NOTE — Telephone Encounter (Signed)
She called back and I verbally gave her the ultrasound instructions.

## 2017-10-13 ENCOUNTER — Ambulatory Visit (HOSPITAL_COMMUNITY)
Admission: RE | Admit: 2017-10-13 | Discharge: 2017-10-13 | Disposition: A | Discharge status: Home / Self Care | Payer: PRIVATE HEALTH INSURANCE | Source: Ambulatory Visit | Attending: Gastroenterology | Admitting: Gastroenterology

## 2017-10-13 ENCOUNTER — Encounter: Payer: Self-pay | Admitting: Gastroenterology

## 2017-10-13 DIAGNOSIS — R1013 Epigastric pain: Secondary | ICD-10-CM | POA: Diagnosis not present

## 2017-10-17 ENCOUNTER — Telehealth: Payer: Self-pay

## 2017-10-17 DIAGNOSIS — R1013 Epigastric pain: Secondary | ICD-10-CM

## 2017-10-17 NOTE — Telephone Encounter (Signed)
Per Dr Chales AbrahamsGupta ordered HIDA Scan. Patient scheduled on 10/28/17 7:30am. Left message for patient to return my call.

## 2017-10-21 NOTE — Telephone Encounter (Signed)
Patient returned phone call. °

## 2017-10-28 ENCOUNTER — Encounter: Payer: Self-pay | Admitting: Gastroenterology

## 2017-10-28 ENCOUNTER — Encounter (HOSPITAL_COMMUNITY)
Admission: RE | Admit: 2017-10-28 | Discharge: 2017-10-28 | Disposition: A | Discharge status: Home / Self Care | Payer: PRIVATE HEALTH INSURANCE | Source: Ambulatory Visit | Attending: Gastroenterology | Admitting: Gastroenterology

## 2017-10-28 DIAGNOSIS — R1013 Epigastric pain: Secondary | ICD-10-CM | POA: Diagnosis present

## 2017-10-28 MED ORDER — TECHNETIUM TC 99M MEBROFENIN IV KIT: 5.3000 | PACK | Freq: Once | INTRAVENOUS | Status: DC | PRN

## 2017-10-31 ENCOUNTER — Other Ambulatory Visit: Payer: Self-pay

## 2017-10-31 ENCOUNTER — Telehealth: Payer: Self-pay | Admitting: Gastroenterology

## 2017-10-31 DIAGNOSIS — R1013 Epigastric pain: Secondary | ICD-10-CM

## 2017-11-05 NOTE — Telephone Encounter (Signed)
I let her know that she needs to stop all GI meds 8 hours prior to gastric emptying scan.

## 2017-11-18 ENCOUNTER — Encounter (HOSPITAL_COMMUNITY)
Admission: RE | Admit: 2017-11-18 | Discharge: 2017-11-18 | Disposition: A | Discharge status: Home / Self Care | Payer: PRIVATE HEALTH INSURANCE | Source: Ambulatory Visit | Attending: Gastroenterology | Admitting: Gastroenterology

## 2017-11-18 DIAGNOSIS — R1013 Epigastric pain: Secondary | ICD-10-CM | POA: Diagnosis not present

## 2017-11-18 MED ORDER — TECHNETIUM TC 99M SULFUR COLLOID
2.1000 | Freq: Once | INTRAVENOUS | Status: AC | PRN
  Administered 2017-11-18: 2.1 via INTRAVENOUS

## 2017-11-19 ENCOUNTER — Telehealth: Payer: Self-pay | Admitting: *Deleted

## 2017-11-19 NOTE — Telephone Encounter (Signed)
Left a message for patient to call back. 

## 2017-11-19 NOTE — Telephone Encounter (Signed)
Spoke with patient and she referred to 10/28/17 patient email. She was under the impression she would have a surgical referral if GES was negative. Please, advise.

## 2017-11-19 NOTE — Telephone Encounter (Signed)
Patient states Dr. Chales AbrahamsGupta was going to make a surgical referral after her GES results. Please, advise.

## 2017-11-19 NOTE — Telephone Encounter (Signed)
She had negative ultrasound, HIDA scan ejection fraction and gastric emptying scan. No indication for any surgery. Let's just treat her medically and see how she does. Please let her know

## 2017-11-21 ENCOUNTER — Encounter: Payer: Self-pay | Admitting: Gastroenterology

## 2017-11-23 NOTE — Telephone Encounter (Signed)
Have talked to the patient in detail.  She had abdominal pain last Friday with associated abdominal bloating.  The pain was mainly in the epigastric area.  She has alternating diarrhea and constipation.  Diarrhea meeting softer bowel movements.  She had negative EGD, colonoscopy, gastric emptying scan, ultrasound and HIDA scan with ejection fraction.  Although it is mentioned in the report that the patient did not have any abdominal pain after ensure.  In fact, patient did have significant abdominal bloating and abdominal pain.  Still ejection fraction was normal.  I have discussed the plan of action with the patient in detail. Note from rheumatology was reviewed.  No evidence of any autoimmune problems.  Plan: 1.  Switch omeprazole to Dexilant 60 mg p.o. once a day (she will get samples from our office). 2.  Trial of gluten-free diet for 2 weeks.  She or her mom will get information from our office. 3. If still with problems, would consider CT scan of the abdomen and pelvis possibly followed by hydrogen breath test and surgical opinion to determine if laparoscopic cholecystectomy would help or not (patient did have symptoms after the Ensure).

## 2017-11-27 NOTE — Telephone Encounter (Signed)
Called and left message for patient to return phone call.

## 2018-01-05 ENCOUNTER — Other Ambulatory Visit: Payer: Self-pay

## 2018-01-05 ENCOUNTER — Telehealth: Payer: Self-pay

## 2018-01-05 DIAGNOSIS — G43A0 Cyclical vomiting, not intractable: Secondary | ICD-10-CM

## 2018-01-05 DIAGNOSIS — R1013 Epigastric pain: Secondary | ICD-10-CM

## 2018-01-05 DIAGNOSIS — K219 Gastro-esophageal reflux disease without esophagitis: Secondary | ICD-10-CM

## 2018-01-05 MED ORDER — ONDANSETRON 8 MG PO TBDP: 8.0000 mg | ORAL_TABLET | Freq: Three times a day (TID) | ORAL | 0 refills | Status: DC | PRN

## 2018-01-05 MED ORDER — METOCLOPRAMIDE HCL 10 MG PO TABS: 10.0000 mg | ORAL_TABLET | Freq: Three times a day (TID) | ORAL | 2 refills | Status: AC

## 2018-01-05 NOTE — Telephone Encounter (Signed)
She is scheduled for a CT scan of the abd and pelvis with contrast on 01-13-18 at 9:30am [facility].  She is to drink first bottle of contrast at 7:30 am and second at 8:30 am, arrival time of 9:15 am.  She also needs to have a BUN and creatinine levels drawn prior to the scan.  She will have lab drawn here at the lab in the basement and will pick up the contrast on the third floor.

## 2018-01-09 ENCOUNTER — Other Ambulatory Visit (INDEPENDENT_AMBULATORY_CARE_PROVIDER_SITE_OTHER): Payer: PRIVATE HEALTH INSURANCE

## 2018-01-09 DIAGNOSIS — R1013 Epigastric pain: Secondary | ICD-10-CM | POA: Diagnosis not present

## 2018-01-09 DIAGNOSIS — K219 Gastro-esophageal reflux disease without esophagitis: Secondary | ICD-10-CM | POA: Diagnosis not present

## 2018-01-09 LAB — BUN: BUN: 13 mg/dL (ref 6–23)

## 2018-01-09 LAB — CREATININE, SERUM: Creatinine, Ser: 1.26 mg/dL — ABNORMAL HIGH (ref 0.40–1.20)

## 2018-01-13 ENCOUNTER — Ambulatory Visit (HOSPITAL_COMMUNITY)
Admission: RE | Admit: 2018-01-13 | Discharge: 2018-01-13 | Disposition: A | Discharge status: Home / Self Care | Payer: PRIVATE HEALTH INSURANCE | Source: Ambulatory Visit | Attending: Gastroenterology | Admitting: Gastroenterology

## 2018-01-13 ENCOUNTER — Encounter (HOSPITAL_COMMUNITY): Payer: Self-pay

## 2018-01-13 DIAGNOSIS — R1013 Epigastric pain: Secondary | ICD-10-CM

## 2018-01-13 MED ORDER — IOHEXOL 300 MG/ML  SOLN
100.0000 mL | Freq: Once | INTRAMUSCULAR | Status: AC | PRN
  Administered 2018-01-13: 100 mL via INTRAVENOUS

## 2018-01-26 ENCOUNTER — Other Ambulatory Visit: Payer: Self-pay | Admitting: Obstetrics and Gynecology

## 2018-01-26 DIAGNOSIS — R928 Other abnormal and inconclusive findings on diagnostic imaging of breast: Secondary | ICD-10-CM

## 2018-02-03 ENCOUNTER — Ambulatory Visit
Admission: RE | Admit: 2018-02-03 | Discharge: 2018-02-03 | Disposition: A | Discharge status: Home / Self Care | Payer: PRIVATE HEALTH INSURANCE | Source: Ambulatory Visit | Attending: Obstetrics and Gynecology | Admitting: Obstetrics and Gynecology

## 2018-02-03 ENCOUNTER — Ambulatory Visit: Payer: PRIVATE HEALTH INSURANCE

## 2018-02-03 DIAGNOSIS — R928 Other abnormal and inconclusive findings on diagnostic imaging of breast: Secondary | ICD-10-CM

## 2018-02-22 NOTE — Telephone Encounter (Signed)
Looks like that she is still having problems. Try Levsin 0.125 mg 1 to 2 tablets sublingual every 4-6 hours as needed for abdominal pain if she has not used it already. Lets also check CBC, CMP, lipase, C-reactive protein. Please have her come back for follow-up visit this week or next week (needs 30 min slot)

## 2018-02-26 NOTE — Telephone Encounter (Signed)
Please see the previous telephone encounter. -Lets get x-ray KUB supine and upright for abdominal pain. -Try Levsin 0.125 mg 1 to 2 tablets sublingual every 4-6 hours as needed. -Check labs -CBC, CMP, lipase and CRP (if not done already). -30-minute slot for office visit. -If with abdominal bloating, hydrogen breath test to rule out small bowel bacterial overgrowth. -Also lets initiate surgical consultation for opinion regarding questionable biliary dyskinesia.  Patient did have symptoms after taking an Ensure.

## 2018-02-27 ENCOUNTER — Other Ambulatory Visit: Payer: Self-pay

## 2018-02-27 DIAGNOSIS — R109 Unspecified abdominal pain: Secondary | ICD-10-CM

## 2018-02-27 MED ORDER — HYOSCYAMINE SULFATE 0.125 MG SL SUBL: SUBLINGUAL_TABLET | SUBLINGUAL | 0 refills | Status: DC

## 2018-03-02 ENCOUNTER — Ambulatory Visit (HOSPITAL_BASED_OUTPATIENT_CLINIC_OR_DEPARTMENT_OTHER)
Admission: RE | Admit: 2018-03-02 | Discharge: 2018-03-02 | Disposition: A | Discharge status: Home / Self Care | Payer: PRIVATE HEALTH INSURANCE | Source: Ambulatory Visit | Attending: Gastroenterology | Admitting: Gastroenterology

## 2018-03-02 ENCOUNTER — Other Ambulatory Visit (INDEPENDENT_AMBULATORY_CARE_PROVIDER_SITE_OTHER): Payer: PRIVATE HEALTH INSURANCE

## 2018-03-02 DIAGNOSIS — R109 Unspecified abdominal pain: Secondary | ICD-10-CM | POA: Diagnosis present

## 2018-03-02 LAB — CBC WITH DIFFERENTIAL/PLATELET
BASOS ABS: 0.1 10*3/uL (ref 0.0–0.1)
Basophils Relative: 1.2 % (ref 0.0–3.0)
Eosinophils Absolute: 0.3 10*3/uL (ref 0.0–0.7)
Eosinophils Relative: 3.5 % (ref 0.0–5.0)
HEMATOCRIT: 36.7 % (ref 36.0–46.0)
HEMOGLOBIN: 12.4 g/dL (ref 12.0–15.0)
Lymphocytes Relative: 38.3 % (ref 12.0–46.0)
Lymphs Abs: 3.1 10*3/uL (ref 0.7–4.0)
MCHC: 33.9 g/dL (ref 30.0–36.0)
MCV: 93.9 fl (ref 78.0–100.0)
MONOS PCT: 5.4 % (ref 3.0–12.0)
Monocytes Absolute: 0.4 10*3/uL (ref 0.1–1.0)
NEUTROS ABS: 4.2 10*3/uL (ref 1.4–7.7)
Neutrophils Relative %: 51.6 % (ref 43.0–77.0)
Platelets: 348 10*3/uL (ref 150.0–400.0)
RBC: 3.91 Mil/uL (ref 3.87–5.11)
RDW: 14.1 % (ref 11.5–15.5)
WBC: 8.1 10*3/uL (ref 4.0–10.5)

## 2018-03-02 LAB — COMPREHENSIVE METABOLIC PANEL
ALBUMIN: 4 g/dL (ref 3.5–5.2)
ALK PHOS: 79 U/L (ref 39–117)
ALT: 12 U/L (ref 0–35)
AST: 10 U/L (ref 0–37)
BILIRUBIN TOTAL: 0.3 mg/dL (ref 0.2–1.2)
BUN: 13 mg/dL (ref 6–23)
CO2: 22 mEq/L (ref 19–32)
Calcium: 9.2 mg/dL (ref 8.4–10.5)
Chloride: 106 mEq/L (ref 96–112)
Creatinine, Ser: 1.21 mg/dL — ABNORMAL HIGH (ref 0.40–1.20)
GFR: 51.73 mL/min — AB (ref >60.00)
Glucose, Bld: 88 mg/dL (ref 70–99)
POTASSIUM: 4 meq/L (ref 3.5–5.1)
Sodium: 137 mEq/L (ref 135–145)
Total Protein: 7 g/dL (ref 6.0–8.3)

## 2018-03-02 LAB — HIGH SENSITIVITY CRP: CRP, High Sensitivity: 8.68 mg/L — ABNORMAL HIGH (ref 0.000–5.000)

## 2018-03-02 LAB — LIPASE: LIPASE: 18 U/L (ref 11.0–59.0)

## 2018-03-09 ENCOUNTER — Ambulatory Visit (INDEPENDENT_AMBULATORY_CARE_PROVIDER_SITE_OTHER): Payer: PRIVATE HEALTH INSURANCE | Admitting: Gastroenterology

## 2018-03-09 ENCOUNTER — Encounter: Payer: Self-pay | Admitting: Gastroenterology

## 2018-03-09 VITALS — BP 110/80 | HR 76 | Ht 66.0 in | Wt 199.0 lb

## 2018-03-09 DIAGNOSIS — K589 Irritable bowel syndrome without diarrhea: Secondary | ICD-10-CM | POA: Diagnosis not present

## 2018-03-09 DIAGNOSIS — K219 Gastro-esophageal reflux disease without esophagitis: Secondary | ICD-10-CM | POA: Diagnosis not present

## 2018-03-09 DIAGNOSIS — R1013 Epigastric pain: Secondary | ICD-10-CM

## 2018-03-09 MED ORDER — PRUCALOPRIDE SUCCINATE 2 MG PO TABS: 2.0000 mg | ORAL_TABLET | Freq: Every day | ORAL | 3 refills | Status: DC

## 2018-03-09 MED ORDER — PANTOPRAZOLE SODIUM 40 MG PO TBEC: 40.0000 mg | DELAYED_RELEASE_TABLET | Freq: Every day | ORAL | 3 refills | Status: DC

## 2018-03-09 NOTE — Progress Notes (Addendum)
IMPRESSION and PLAN:    #1. Epigastric pain with nausea- neg EGD, colon 08/2017, [place], HIDA with EF 2019 (except for pain after ensure ingestion), neg solid phase GES 7/219, CT 01/2018. Normal CBC, CMP, lipase, sed rate. #2. Gastroesophageal reflux. Neg EGD 08/2017 with SB Bx. #3. IBS with predom constipation (rare diarrhea) #4. History of tubular adenomas. Neg colon 08/2017. Next due 08/2022. #5. H/O migraines, anxiety, endometriosis (being followed by OB/GYN), fibromyalgia with neg autoimmune work-up (Dr. Corliss Skains)  Plan: Continue gluen free diet.  Change omeprazole 40 mg to protonix 40mg  po qd. Trial of Montegrity 2mg  po qd (samples given). Minimize use of Reglan as she is currently doing.  Discussed potential extrapyramidal side effects. If still with problems, would consider surgical opinion for consideration of laparoscopic cholecystectomy with a laparoscopy.  Of note that the patient's gallbladder ejection fraction was normal but she did have symptoms. Minimize use of Reglan as she is currently doing.  Discussed potential for extrapyramidal side effects. Continue MiraLAX prn for now. Increase water intake. Minimize nonsteroidals. Recommend cardiac eval - RE: palpitations, with dizziness with tachycardia on apple- watch (cardiology). FU in 12 weeks, earlier if still with problems. Patient is to call us if still with problems.  All the contact numbers were given.   HPI:    42 year old For follow-up visit Feels somewhat better on gluten-free diet Still with epigastric abdominal pain with occasional nausea and occasional vomiting. Had extensive negative GI work-up as detailed above. Also sees GYN for possible endometriosis  She has been on omeprazole without any significant relief.  She has been taking omeprazole for the last 2-3 years.  No regurgitation, odynophagia or dysphagia. There is no melena or hematochezia. No unintentional weight loss.  Also complains about  palpitations, no chest pains, tachycardia with heart rate of 160s on apple watch. No diaphoresis.  Has associated anxiety, fibromyalgia, migraines as above.      Review of Systems:  Neg except for HPI     Allergies: NKDA     Past Medical History:  Diagnosis Date  . Fibromyalgia 01/2017  . Headache(784.0)    Migraines  . Osteoarthritis 01/2017   Past Surgical History:  Procedure Laterality Date  . ABDOMINAL HYSTERECTOMY  2010  . APPENDECTOMY  7/12  . cesearean   '96 '00  . DIAGNOSTIC LAPAROSCOPY  2001  . LAPAROSCOPY  04/26/2011   Procedure: LAPAROSCOPY OPERATIVE;  Surgeon: Roselle Locus II;  Location: [facility];  Service: Gynecology;  Laterality: N/A;  ablation of endometriosis  . SALPINGOOPHORECTOMY  04/26/2011   Procedure: SALPINGO OOPHERECTOMY;  Surgeon: Roselle Locus II;  Location: [facility];  Service: Gynecology;  Laterality: Left;   Family History  Problem Relation Age of Onset  . Lung cancer Father 27  . Brain cancer Father 94  . High blood pressure Mother   . Diabetes Mother   . Lupus Maternal Grandmother   . Lymphoma Maternal Grandmother   . Heart disease Maternal Grandfather   . Diabetes Maternal Grandfather     Current Outpatient Medications  Medication Sig Dispense Refill  . albuterol (VENTOLIN HFA) 108 (90 Base) MCG/ACT inhaler Inhale into the lungs every 6 (six) hours as needed for wheezing or shortness of breath.    . ALPRAZolam (XANAX) 0.5 MG tablet Take 0.5 mg by mouth at bedtime as needed for anxiety.    . cyclobenzaprine (FLEXERIL) 10 MG tablet Take 10 mg by mouth at bedtime.     . hyoscyamine (LEVSIN  SL) 0.125 MG SL tablet Take 1-2 by mouth every 4-6 hours as needed 30 tablet 0  . ibuprofen (ADVIL,MOTRIN) 200 MG tablet Take 200 mg by mouth every 6 (six) hours as needed.    . metoCLOPramide (REGLAN) 10 MG tablet Take 1 tablet (10 mg total) by mouth 4 (four) times daily -  before meals and at bedtime. Take for 2 weeks. 56 tablet 2  . omeprazole  (PRILOSEC) 40 MG capsule Take 40 mg by mouth daily.    . ondansetron (ZOFRAN ODT) 8 MG disintegrating tablet Take 1 tablet (8 mg total) by mouth every 8 (eight) hours as needed for nausea or vomiting. 20 tablet 0  . Probiotic Product (PROBIOTIC PO) Take by mouth daily.    Marland Kitchen topiramate (TOPAMAX) 25 MG tablet TAKE 2 TABLETS TWICE A DAY (Patient taking differently: take 3 bid) 120 tablet 0   Current Facility-Administered Medications  Medication Dose Route Frequency Provider Last Rate Last Dose  . 0.9 %  sodium chloride infusion  500 mL Intravenous Once Lynann Bologna, MD         Physical Exam:     BP 110/80   Pulse 76   Ht 5\' 6"  (1.676 m)   Wt 199 lb (90.3 kg)   BMI 32.12 kg/m   GENERAL:  Not in NAD PSYCH: :Pleasant, cooperative, normal affect EENT:  conjunctiva pink, mucous membranes moist,  CARDIAC:  RRR, no murmur heard PULM: Normal respiratory effort, lungs CTA bilaterally, no wheezing ABDOMEN:  Nondistended, soft, nontender. No obvious masses, no hepatomegaly,  normal bowel sounds SKIN:  turgor, no lesions seen Musculoskeletal:  Normal muscle tone, normal strength NEURO: Alert and oriented x 3, no focal neurologic deficits Seen in presence of Woodroe Mode CMA 25 minutes spent with the patient today. Greater than 50% was spent in counseling and coordination of care with the patient   03/09/2018, 2:13 PM

## 2018-03-09 NOTE — Patient Instructions (Addendum)
If you are age 42 or older, your body mass index should be between 23-30. Your Body mass index is 32.12 kg/m. If this is out of the aforementioned range listed, please consider follow up with your Primary Care Provider.  If you are age 69 or younger, your body mass index should be between 19-25. Your Body mass index is 32.12 kg/m. If this is out of the aformentioned range listed, please consider follow up with your Primary Care Provider.   We have sent the following medications to your pharmacy for you to pick up at your convenience:  Protonix  Motegrity   Your provider has referred you to Cardiology, you will here from their office with this appointment.   Thank you,  Dr. Lynann Bologna

## 2018-03-27 ENCOUNTER — Other Ambulatory Visit: Payer: Self-pay | Admitting: Gastroenterology

## 2018-03-27 DIAGNOSIS — R1115 Cyclical vomiting syndrome unrelated to migraine: Secondary | ICD-10-CM

## 2018-03-27 DIAGNOSIS — R1013 Epigastric pain: Secondary | ICD-10-CM

## 2018-03-27 NOTE — Progress Notes (Signed)
Cardiology Office Note:    Date:  03/30/2018   ID:  [name], DOB 1976/02/08, MRN [id number]  PCP:  Nonnie Done., MD  Cardiologist:  Norman Herrlich, MD   Referring MD: Nonnie Done., MD  ASSESSMENT:    1. Palpitation   2. History of migraine   3. History of gastroesophageal reflux (GERD)    PLAN:    In order of problems listed above:  1. Her symptoms are most typical of intermittent atrial tachyarrhythmia however taking multiple medicines other consult some right heart rate I am not sure whether this is just sinus tachycardia.  I told her the key is to correlate symptoms with rhythm and will utilize a 2-week extended monitor external loop at this time I would not start her on suppressive therapy without a particular precise diagnosis.  She will minimize the use of her bronchodilator Levsin and Reglan.  I do not think she needs an ischemic evaluation at this time 2. Stable I do not think her migraine medications played a role 3. Continues to be symptomatic managed by GI  Next appointment 6 weeks   Medication Adjustments/Labs and Tests Ordered: Current medicines are reviewed at length with the patient today.  Concerns regarding medicines are outlined above.  Orders Placed This Encounter  Procedures  . LONG TERM MONITOR (3-14 DAYS)  . EKG 12-Lead   No orders of the defined types were placed in this encounter.    Chief Complaint  Patient presents with  . Palpitations    History of Present Illness:    [name] is a 42 y.o. female who is being seen today for the evaluation of palpitation at the request of Dr Chales Abrahams GI.Marland Kitchen  She takes 3 medications that can affect her heart rhythm and heart rates including metoclopramide Ventolin and Levsin.  Background history of perhaps 10 years ago an episode of rapid rhythm with lightheadedness that resolved spontaneously.  Been very bothered by GI symptoms including typical reflux and gaseous sensation abdominal pain is  undergone extensive evaluation.  She has asthma uses inhaler as needed frequently takes Levsin infrequently and takes Reglan several times a week.  She has episodes of rapid heart rhythm at rest up to 160 bpm with and without activity.  She told me when she went into the physician's office recently her first heart rate was 148 bpm.  Unfortunately she has no documented arrhythmia.  She has a apple watch and her heart rate distributions are up to the 130s and 140s but it does not show rhythm or association with activity.  She describes the onset as abrupt rapid heart rate shortness of breath lightheadedness typically last a few minutes longest 15 minutes she tries deep breathing and may or may not help she takes no over-the-counter proarrhythmic drugs has no known heart disease.  Overall she does not feel well she has a great deal of fatigue but is not having angina she had a recent respiratory infection continues to cough and uses inhaler as needed she thinks she has had intermittent edema and this can also be seen with Reglan.  She has no history of congenital or rheumatic heart disease Past Medical History:  Diagnosis Date  . Fibromyalgia 01/2017  . Headache(784.0)    Migraines  . Osteoarthritis 01/2017    Past Surgical History:  Procedure Laterality Date  . ABDOMINAL HYSTERECTOMY  2010  . APPENDECTOMY  7/12  . cesearean   '96 '00  . DIAGNOSTIC LAPAROSCOPY  2001  .  LAPAROSCOPY  04/26/2011   Procedure: LAPAROSCOPY OPERATIVE;  Surgeon: Roselle Locus II;  Location: [facility];  Service: Gynecology;  Laterality: N/A;  ablation of endometriosis  . SALPINGOOPHORECTOMY  04/26/2011   Procedure: SALPINGO OOPHERECTOMY;  Surgeon: Roselle Locus II;  Location: [facility];  Service: Gynecology;  Laterality: Left;    Current Medications: Current Meds  Medication Sig  . albuterol (VENTOLIN HFA) 108 (90 Base) MCG/ACT inhaler Inhale into the lungs every 6 (six) hours as needed for wheezing or shortness of breath.   . ALPRAZolam (XANAX) 0.5 MG tablet Take 0.5 mg by mouth at bedtime as needed for anxiety.  . cyclobenzaprine (FLEXERIL) 10 MG tablet Take 10 mg by mouth at bedtime.   . hyoscyamine (LEVSIN SL) 0.125 MG SL tablet Take 1-2 by mouth every 4-6 hours as needed  . ibuprofen (ADVIL,MOTRIN) 200 MG tablet Take 200 mg by mouth every 6 (six) hours as needed.  . metoCLOPramide (REGLAN) 10 MG tablet Take 1 tablet (10 mg total) by mouth 4 (four) times daily -  before meals and at bedtime. Take for 2 weeks.  Marland Kitchen omeprazole (PRILOSEC) 40 MG capsule Take 40 mg by mouth daily.  . ondansetron (ZOFRAN ODT) 8 MG disintegrating tablet Take 1 tablet (8 mg total) by mouth every 8 (eight) hours as needed for nausea or vomiting.  . Probiotic Product (PROBIOTIC PO) Take 1 tablet by mouth daily.   Marland Kitchen topiramate (TOPAMAX) 25 MG tablet Take 3 tablets by mouth 2 (two) times daily.   Current Facility-Administered Medications for the 03/30/18 encounter (Office Visit) with Baldo Daub, MD  Medication  . 0.9 %  sodium chloride infusion     Allergies:   Patient has no known allergies.   Social History   Socioeconomic History  . Marital status: Divorced    Spouse name: Not on file  . Number of children: Not on file  . Years of education: Not on file  . Highest education level: Not on file  Occupational History  . Not on file  Social Needs  . Financial resource strain: Not on file  . Food insecurity:    Worry: Not on file    Inability: Not on file  . Transportation needs:    Medical: Not on file    Non-medical: Not on file  Tobacco Use  . Smoking status: Current Every Day Smoker    Packs/day: 0.50  . Smokeless tobacco: Never Used  Substance and Sexual Activity  . Alcohol use: Not Currently  . Drug use: No  . Sexual activity: Not Currently  Lifestyle  . Physical activity:    Days per week: Not on file    Minutes per session: Not on file  . Stress: Not on file  Relationships  . Social connections:     Talks on phone: Not on file    Gets together: Not on file    Attends religious service: Not on file    Active member of club or organization: Not on file    Attends meetings of clubs or organizations: Not on file    Relationship status: Not on file  Other Topics Concern  . Not on file  Social History Narrative  . Not on file     Family History: The patient's family history includes Brain cancer (age of onset: 47) in her father; Diabetes in her maternal grandfather and mother; Heart disease in her maternal grandfather; High blood pressure in her mother; Lung cancer (age of onset: 69) in  her father; Lupus in her maternal grandmother; Lymphoma in her maternal grandmother.  ROS:   Review of Systems  Constitution: Positive for malaise/fatigue and weight gain.  HENT: Negative.   Eyes: Positive for visual disturbance.  Cardiovascular: Positive for irregular heartbeat, leg swelling and palpitations.  Respiratory: Positive for cough, shortness of breath and wheezing.   Endocrine: Negative.   Hematologic/Lymphatic: Negative.   Skin: Negative.   Musculoskeletal: Positive for back pain and muscle weakness.  Gastrointestinal: Positive for abdominal pain and heartburn.  Genitourinary: Negative.   Neurological: Positive for dizziness and headaches.  Psychiatric/Behavioral: Negative.   Allergic/Immunologic: Negative.    Please see the history of present illness.     All other systems reviewed and are negative.  EKGs/Labs/Other Studies Reviewed:    The following studies were reviewed today:   EKG:  EKG is  ordered today.  [facility]  Recent Labs: 03/02/2018: ALT 12; BUN 13; Creatinine, Ser 1.21; Hemoglobin 12.4; Platelets 348.0; Potassium 4.0; Sodium 137  Recent Lipid Panel No results found for: CHOL, TRIG, HDL, CHOLHDL, VLDL, LDLCALC, LDLDIRECT  Physical Exam:    VS:  BP 98/72 (BP Location: Right Arm, Patient Position: Sitting, Cuff Size:  Normal)   Pulse 82   Ht 5' 6.5" (1.689 m)   Wt 199 lb 12.8 oz (90.6 kg)   SpO2 92%   BMI 31.77 kg/m     Wt Readings from Last 3 Encounters:  03/30/18 199 lb 12.8 oz (90.6 kg)  03/09/18 199 lb (90.3 kg)  09/01/17 221 lb (100.2 kg)     GEN:  Well nourished, well developed in no acute distress HEENT: Normal NECK: No JVD; No carotid bruits LYMPHATICS: No lymphadenopathy CARDIAC: RRR, no murmurs, rubs, gallops RESPIRATORY:  Clear to auscultation without rales, wheezing or rhonchi  ABDOMEN: Soft, non-tender, non-distended MUSCULOSKELETAL:  No edema; No deformity  SKIN: Warm and dry NEUROLOGIC:  Alert and oriented x 3 PSYCHIATRIC:  Normal affect     Signed, Norman HerrlichBrian Munley, MD  03/30/2018 11:14 AM    [facility]

## 2018-03-30 ENCOUNTER — Ambulatory Visit (INDEPENDENT_AMBULATORY_CARE_PROVIDER_SITE_OTHER): Payer: PRIVATE HEALTH INSURANCE | Admitting: Cardiology

## 2018-03-30 ENCOUNTER — Encounter: Payer: Self-pay | Admitting: Cardiology

## 2018-03-30 VITALS — BP 98/72 | HR 82 | Ht 66.5 in | Wt 199.8 lb

## 2018-03-30 DIAGNOSIS — Z8669 Personal history of other diseases of the nervous system and sense organs: Secondary | ICD-10-CM

## 2018-03-30 DIAGNOSIS — Z8719 Personal history of other diseases of the digestive system: Secondary | ICD-10-CM

## 2018-03-30 DIAGNOSIS — R002 Palpitations: Secondary | ICD-10-CM

## 2018-03-30 HISTORY — DX: Palpitations: R00.2

## 2018-03-30 NOTE — Patient Instructions (Addendum)
Medication Instructions:  Your physician recommends that you continue on your current medications as directed. Please refer to the Current Medication list given to you today.  If you need a refill on your cardiac medications before your next appointment, please call your pharmacy.   Lab work: None  If you have labs (blood work) drawn today and your tests are completely normal, you will receive your results only by: Marland Kitchen. MyChart Message (if you have MyChart) OR . A paper copy in the mail If you have any lab test that is abnormal or we need to change your treatment, we will call you to review the results.  Testing/Procedures: You had an EKG today.  Your physician has recommended that you wear a ZIO patch monitor. ZIO patch monitors are medical devices that record the heart's electrical activity. Doctors most often use these monitors to diagnose arrhythmias. Arrhythmias are problems with the speed or rhythm of the heartbeat. The monitor is a small, portable device. You can wear one while you do your normal daily activities. This is usually used to diagnose what is causing palpitations/syncope (passing out). Wear for 14 days.   Follow-Up: At [facility], you and your health needs are our priority.  As part of our continuing mission to provide you with exceptional heart care, we have created designated Provider Care Teams.  These Care Teams include your primary Cardiologist (physician) and Advanced Practice Providers (APPs -  Physician Assistants and Nurse Practitioners) who all work together to provide you with the care you need, when you need it. You will need a follow up appointment in 6 weeks.         1. Avoid all over-the-counter antihistamines except Claritin/Loratadine and Zyrtec/Cetrizine. 2. Avoid all combination including cold sinus allergies flu decongestant and sleep medications 3. You can use Robitussin DM Mucinex and Mucinex DM for cough. 4. can use Tylenol aspirin ibuprofen  and naproxen but no combinations such as sleep or sinus.

## 2018-04-03 ENCOUNTER — Ambulatory Visit (INDEPENDENT_AMBULATORY_CARE_PROVIDER_SITE_OTHER): Payer: PRIVATE HEALTH INSURANCE

## 2018-04-03 DIAGNOSIS — R002 Palpitations: Secondary | ICD-10-CM | POA: Diagnosis not present

## 2018-04-23 ENCOUNTER — Other Ambulatory Visit: Payer: Self-pay | Admitting: Gastroenterology

## 2018-04-23 DIAGNOSIS — R1013 Epigastric pain: Secondary | ICD-10-CM

## 2018-04-23 DIAGNOSIS — R1115 Cyclical vomiting syndrome unrelated to migraine: Secondary | ICD-10-CM

## 2018-04-29 ENCOUNTER — Other Ambulatory Visit: Payer: Self-pay | Admitting: Gastroenterology

## 2018-04-29 DIAGNOSIS — R1013 Epigastric pain: Secondary | ICD-10-CM

## 2018-04-29 DIAGNOSIS — R1115 Cyclical vomiting syndrome unrelated to migraine: Secondary | ICD-10-CM

## 2018-05-19 NOTE — Progress Notes (Signed)
Cardiology Office Note:    Date:  05/20/2018   ID:  [name], DOB 1976/05/03, MRN [id number]  PCP:  Nonnie Done., MD  Cardiologist:  Norman Herrlich, MD    Referring MD: Nonnie Done., MD    ASSESSMENT:    1. Palpitation    PLAN:    In order of problems listed above:  1. She continues to have palpitation and what she has is awareness of sinus tachycardia influenced by multiple medications accelerating her heart rate.  We discussed the potential of putting her on a beta-blocker but could exacerbate asthma have CNS side effects I think the best approach here is reassured she agrees and I gave her a list of proarrhythmic drugs to avoid.  I will see back in the office as needed 2. She asked me to look at an MRI report of her spine which talks about a flow void in the vertebral artery I asked her when she sees the physician ordered it on Friday to discuss if she should have an MR angiogram to describe whether this is artifactual congenital single vertebral artery if she is having arterial injury.   Next appointment: as needed   Medication Adjustments/Labs and Tests Ordered: Current medicines are reviewed at length with the patient today.  Concerns regarding medicines are outlined above.  No orders of the defined types were placed in this encounter.  No orders of the defined types were placed in this encounter.   Chief Complaint  Patient presents with  . Follow-up  . Palpitations    History of Present Illness:    [name] is a 43 y.o. female with a hx of palpitation  last seen 03/30/18  She takes 3 medications that can affect her heart rhythm and heart rates including metoclopramide Ventolin and Levsin  A 14 day Zio showed rare VE and SVE, diary events were not associated with arrhythmia but usually sinus tachycardia.  Compliance with diet, lifestyle and medications: Yes  Continues to have awareness of her heart beating and is also having some neurologic  symptoms where she is having numbness in the right arm and had an MRI with cervical spine disc disease. Past Medical History:  Diagnosis Date  . Fibromyalgia 01/2017  . Headache(784.0)    Migraines  . Osteoarthritis 01/2017    Past Surgical History:  Procedure Laterality Date  . ABDOMINAL HYSTERECTOMY  2010  . APPENDECTOMY  7/12  . cesearean   '96 '00  . DIAGNOSTIC LAPAROSCOPY  2001  . LAPAROSCOPY  04/26/2011   Procedure: LAPAROSCOPY OPERATIVE;  Surgeon: Roselle Locus II;  Location: [facility];  Service: Gynecology;  Laterality: N/A;  ablation of endometriosis  . SALPINGOOPHORECTOMY  04/26/2011   Procedure: SALPINGO OOPHERECTOMY;  Surgeon: Roselle Locus II;  Location: [facility];  Service: Gynecology;  Laterality: Left;    Current Medications: Current Meds  Medication Sig  . albuterol (VENTOLIN HFA) 108 (90 Base) MCG/ACT inhaler Inhale into the lungs every 6 (six) hours as needed for wheezing or shortness of breath.  . ALPRAZolam (XANAX) 0.5 MG tablet Take 0.5 mg by mouth 3 (three) times daily as needed for anxiety.   . cyclobenzaprine (FLEXERIL) 10 MG tablet Take 10 mg by mouth 2 (two) times daily.   Marland Kitchen ibuprofen (ADVIL,MOTRIN) 200 MG tablet Take 600 mg by mouth every 6 (six) hours as needed.   . metoCLOPramide (REGLAN) 10 MG tablet Take 1 tablet (10 mg total) by mouth 4 (four) times daily -  before meals and at bedtime. Take for 2 weeks.  Marland Kitchen omeprazole (PRILOSEC) 40 MG capsule Take 40 mg by mouth daily.  . Probiotic Product (PROBIOTIC PO) Take 1 tablet by mouth daily.   Marland Kitchen topiramate (TOPAMAX) 25 MG tablet Take 3 tablets by mouth 2 (two) times daily.   Current Facility-Administered Medications for the 05/20/18 encounter (Office Visit) with Baldo Daub, MD  Medication  . 0.9 %  sodium chloride infusion     Allergies:   Patient has no known allergies.   Social History   Socioeconomic History  . Marital status: Divorced    Spouse name: Not on file  . Number of children: Not on  file  . Years of education: Not on file  . Highest education level: Not on file  Occupational History  . Not on file  Social Needs  . Financial resource strain: Not on file  . Food insecurity:    Worry: Not on file    Inability: Not on file  . Transportation needs:    Medical: Not on file    Non-medical: Not on file  Tobacco Use  . Smoking status: Current Every Day Smoker    Packs/day: 0.50  . Smokeless tobacco: Never Used  Substance and Sexual Activity  . Alcohol use: Not Currently  . Drug use: No  . Sexual activity: Not Currently  Lifestyle  . Physical activity:    Days per week: Not on file    Minutes per session: Not on file  . Stress: Not on file  Relationships  . Social connections:    Talks on phone: Not on file    Gets together: Not on file    Attends religious service: Not on file    Active member of club or organization: Not on file    Attends meetings of clubs or organizations: Not on file    Relationship status: Not on file  Other Topics Concern  . Not on file  Social History Narrative  . Not on file     Family History: The patient's family history includes Brain cancer (age of onset: 67) in her father; Diabetes in her maternal grandfather and mother; Heart disease in her maternal grandfather; High blood pressure in her mother; Lung cancer (age of onset: 55) in her father; Lupus in her maternal grandmother; Lymphoma in her maternal grandmother. ROS:   Please see the history of present illness.    All other systems reviewed and are negative.  EKGs/Labs/Other Studies Reviewed:    The following studies were reviewed today:  Recent Labs: 03/02/2018: ALT 12; BUN 13; Creatinine, Ser 1.21; Hemoglobin 12.4; Platelets 348.0; Potassium 4.0; Sodium 137   Recent Lipid Panel No results found for: CHOL, TRIG, HDL, CHOLHDL, VLDL, LDLCALC, LDLDIRECT  Physical Exam:    VS:  BP 112/82 (BP Location: Right Arm, Patient Position: Sitting, Cuff Size: Large)   Pulse 82    Ht 5\' 6"  (1.676 m)   Wt 192 lb 2 oz (87.1 kg)   SpO2 96%   BMI 31.01 kg/m     Wt Readings from Last 3 Encounters:  05/20/18 192 lb 2 oz (87.1 kg)  03/30/18 199 lb 12.8 oz (90.6 kg)  03/09/18 199 lb (90.3 kg)     GEN:  Well nourished, well developed in no acute distress HEENT: Normal NECK: No JVD; No carotid bruits LYMPHATICS: No lymphadenopathy CARDIAC: RRR, no murmurs, rubs, gallops RESPIRATORY:  Clear to auscultation without rales, wheezing or rhonchi  ABDOMEN: Soft, non-tender, non-distended  MUSCULOSKELETAL:  No edema; No deformity  SKIN: Warm and dry NEUROLOGIC:  Alert and oriented x 3 PSYCHIATRIC:  Normal affect    Signed, Norman HerrlichBrian Dalissa Lovin, MD  05/20/2018 9:02 AM    [facility]

## 2018-05-20 ENCOUNTER — Ambulatory Visit (INDEPENDENT_AMBULATORY_CARE_PROVIDER_SITE_OTHER): Payer: PRIVATE HEALTH INSURANCE | Admitting: Cardiology

## 2018-05-20 ENCOUNTER — Encounter: Payer: Self-pay | Admitting: Cardiology

## 2018-05-20 ENCOUNTER — Other Ambulatory Visit: Payer: Self-pay | Admitting: Gastroenterology

## 2018-05-20 VITALS — BP 112/82 | HR 82 | Ht 66.0 in | Wt 192.1 lb

## 2018-05-20 DIAGNOSIS — R002 Palpitations: Secondary | ICD-10-CM

## 2018-05-20 DIAGNOSIS — R1013 Epigastric pain: Secondary | ICD-10-CM

## 2018-05-20 DIAGNOSIS — R1115 Cyclical vomiting syndrome unrelated to migraine: Secondary | ICD-10-CM

## 2018-05-20 NOTE — Patient Instructions (Addendum)
Medication Instructions:  Your physician recommends that you continue on your current medications as directed. Please refer to the Current Medication list given to you today.  If you need a refill on your cardiac medications before your next appointment, please call your pharmacy.   Lab work: None  If you have labs (blood work) drawn today and your tests are completely normal, you will receive your results only by: . MyChart Message (if you have MyChart) OR . A paper copy in the mail If you have any lab test that is abnormal or we need to change your treatment, we will call you to review the results.  Testing/Procedures: None  Follow-Up: At [facility], you and your health needs are our priority.  As part of our continuing mission to provide you with exceptional heart care, we have created designated Provider Care Teams.  These Care Teams include your primary Cardiologist (physician) and Advanced Practice Providers (APPs -  Physician Assistants and Nurse Practitioners) who all work together to provide you with the care you need, when you need it. You will need a follow up appointment as needed if symptoms worsen or fail to improve.        1. Avoid all over-the-counter antihistamines except Claritin/Loratadine and Zyrtec/Cetrizine. 2. Avoid all combination including cold sinus allergies flu decongestant and sleep medications 3. You can use Robitussin DM Mucinex and Mucinex DM for cough. 4. can use Tylenol aspirin ibuprofen and naproxen but no combinations such as sleep or sinus. 

## 2018-05-21 NOTE — Telephone Encounter (Signed)
For now let us call in Reglan 10 mg p.o. 4 times daily for 2 weeks prn. #56, 1 refill Use prn I have also reviewed Dr. Hulen Shouts note from yesterday-proceed with further work-up for palpitations. Follow-up thereafter if still with problems.

## 2018-05-22 ENCOUNTER — Other Ambulatory Visit: Payer: Self-pay

## 2018-05-22 DIAGNOSIS — K219 Gastro-esophageal reflux disease without esophagitis: Secondary | ICD-10-CM

## 2018-05-22 MED ORDER — METOCLOPRAMIDE HCL 10 MG PO TABS: 10.0000 mg | ORAL_TABLET | Freq: Four times a day (QID) | ORAL | 1 refills | Status: DC | PRN

## 2018-05-22 NOTE — Telephone Encounter (Signed)
Per patient MyChart message: ----- Message from [employer] to Lynann Bologna, MD sent at 05/20/2018 11:40 AM -----   I sent a message last week (05/11/18) about the nausea and running out of meds. I have sent another request through my pharmacy to try to get this filled. It is going to be close to impossible to function without something for the nausea!! Between my pain levels for other health issues and this my life really stinks...the pain I can handle, but not being able to eat or concentrate because my mouth is watering up due to getting sick is another level of uncomfortable. Especially when you work 40+ hours a week and are sitting in a meeting when it starts happening out of nowhere.    Please advise on plan of care for this patient

## 2018-08-04 ENCOUNTER — Other Ambulatory Visit: Payer: Self-pay | Admitting: Gastroenterology

## 2018-08-04 DIAGNOSIS — K219 Gastro-esophageal reflux disease without esophagitis: Secondary | ICD-10-CM

## 2018-08-12 NOTE — Telephone Encounter (Signed)
I am glad she is doing well on gluten-free diet and salads. Can you please call her and find out if she needs nausea medicines. Follow-up (zoom encounter) in 4 weeks

## 2018-08-14 ENCOUNTER — Other Ambulatory Visit: Payer: Self-pay

## 2018-08-14 DIAGNOSIS — R1013 Epigastric pain: Secondary | ICD-10-CM

## 2018-08-14 MED ORDER — METOCLOPRAMIDE HCL 10 MG PO TABS: 10.0000 mg | ORAL_TABLET | Freq: Four times a day (QID) | ORAL | 2 refills | Status: DC

## 2018-08-14 NOTE — Telephone Encounter (Signed)
Please call in Reglan 10 mg p.o. 4 times daily x 4 weeks (#120), 2 refiils.

## 2018-11-18 ENCOUNTER — Other Ambulatory Visit: Payer: Self-pay | Admitting: Gastroenterology

## 2019-01-28 DIAGNOSIS — M1990 Unspecified osteoarthritis, unspecified site: Secondary | ICD-10-CM | POA: Insufficient documentation

## 2019-03-27 ENCOUNTER — Other Ambulatory Visit: Payer: Self-pay | Admitting: Gastroenterology

## 2019-04-01 IMAGING — DX DG ABDOMEN 2V
3 series · 3 of 3 positions shown · non-contrast
Comparison: Radiographs dated 01/20/2015 and CT scan of the abdomen
dated 01/13/2018

CLINICAL DATA: Diffuse abdominal pain.

EXAM:
ABDOMEN - 2 VIEW

[abdomen erect]
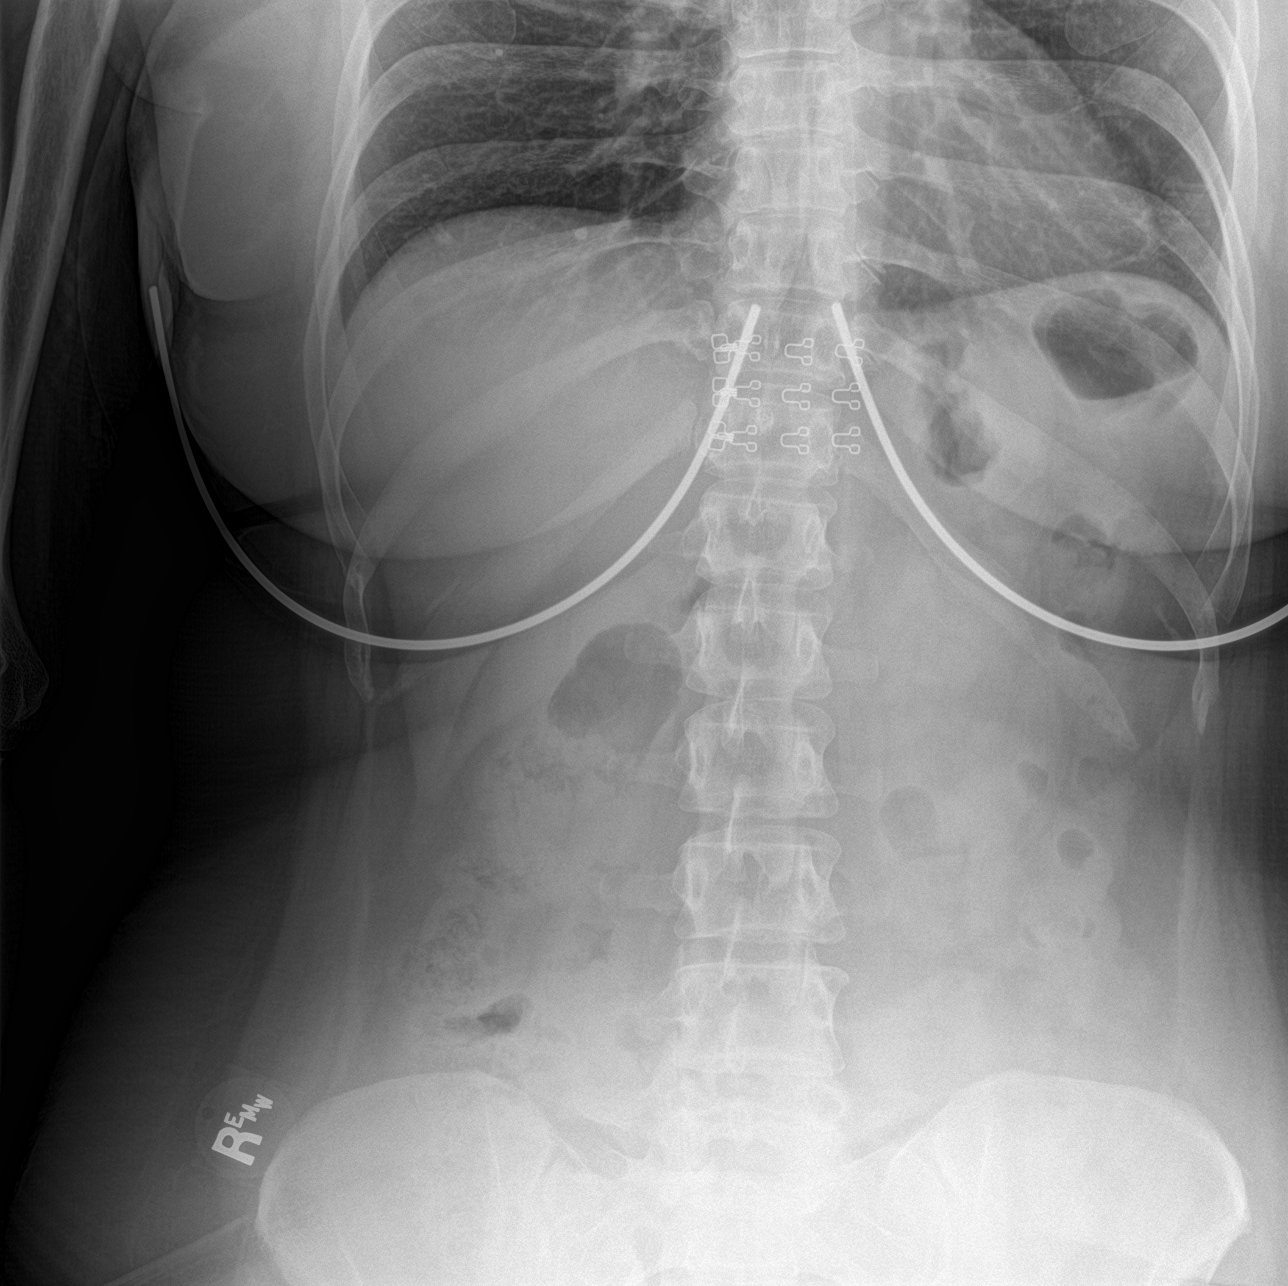

[abdomen supine (1 of 2)]
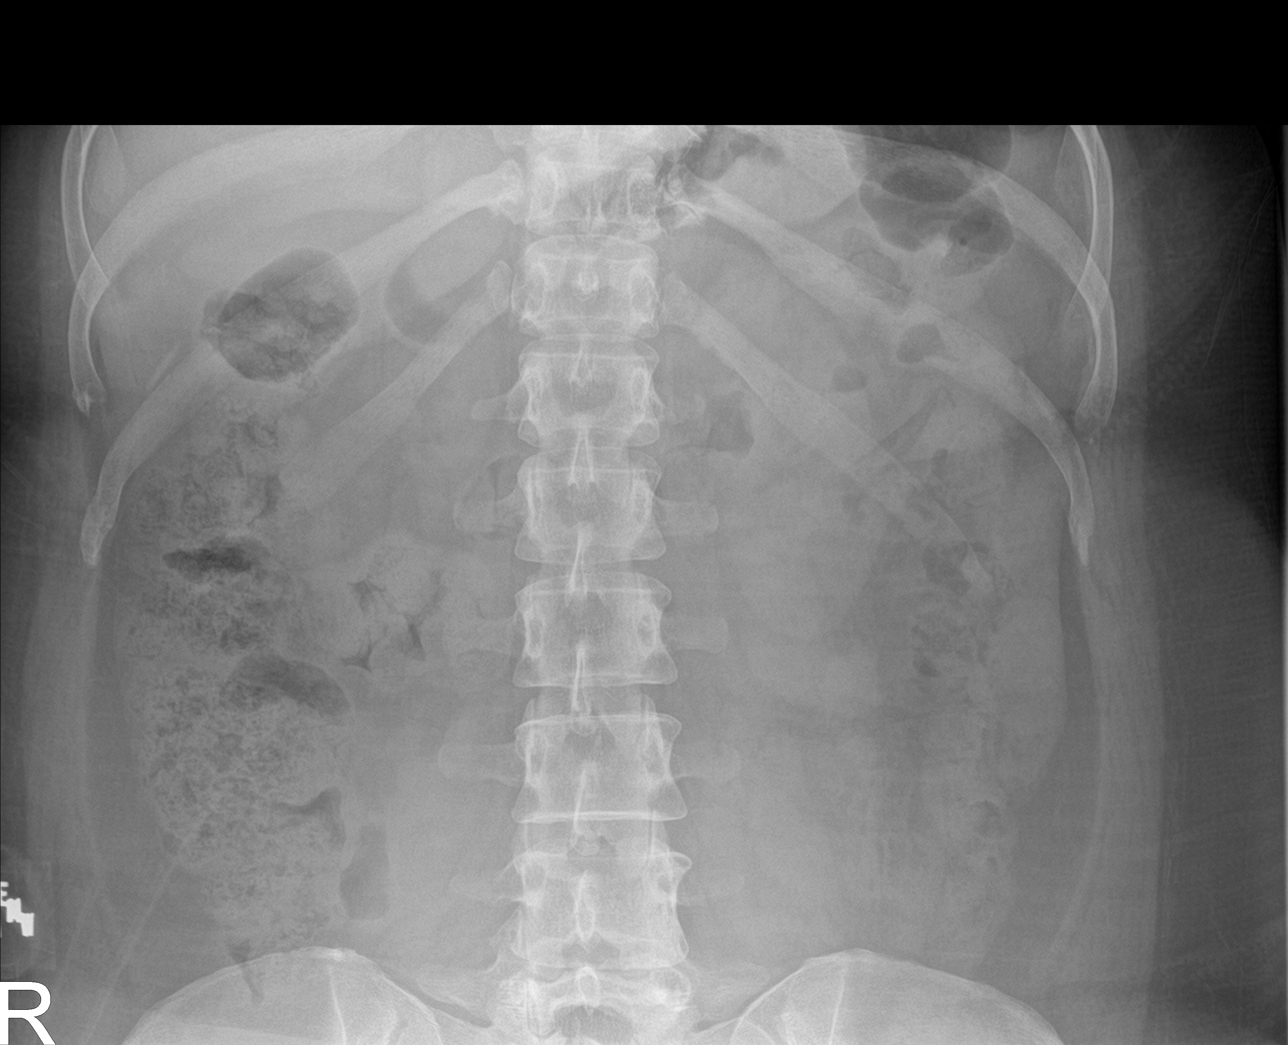

[abdomen supine (2 of 2)]
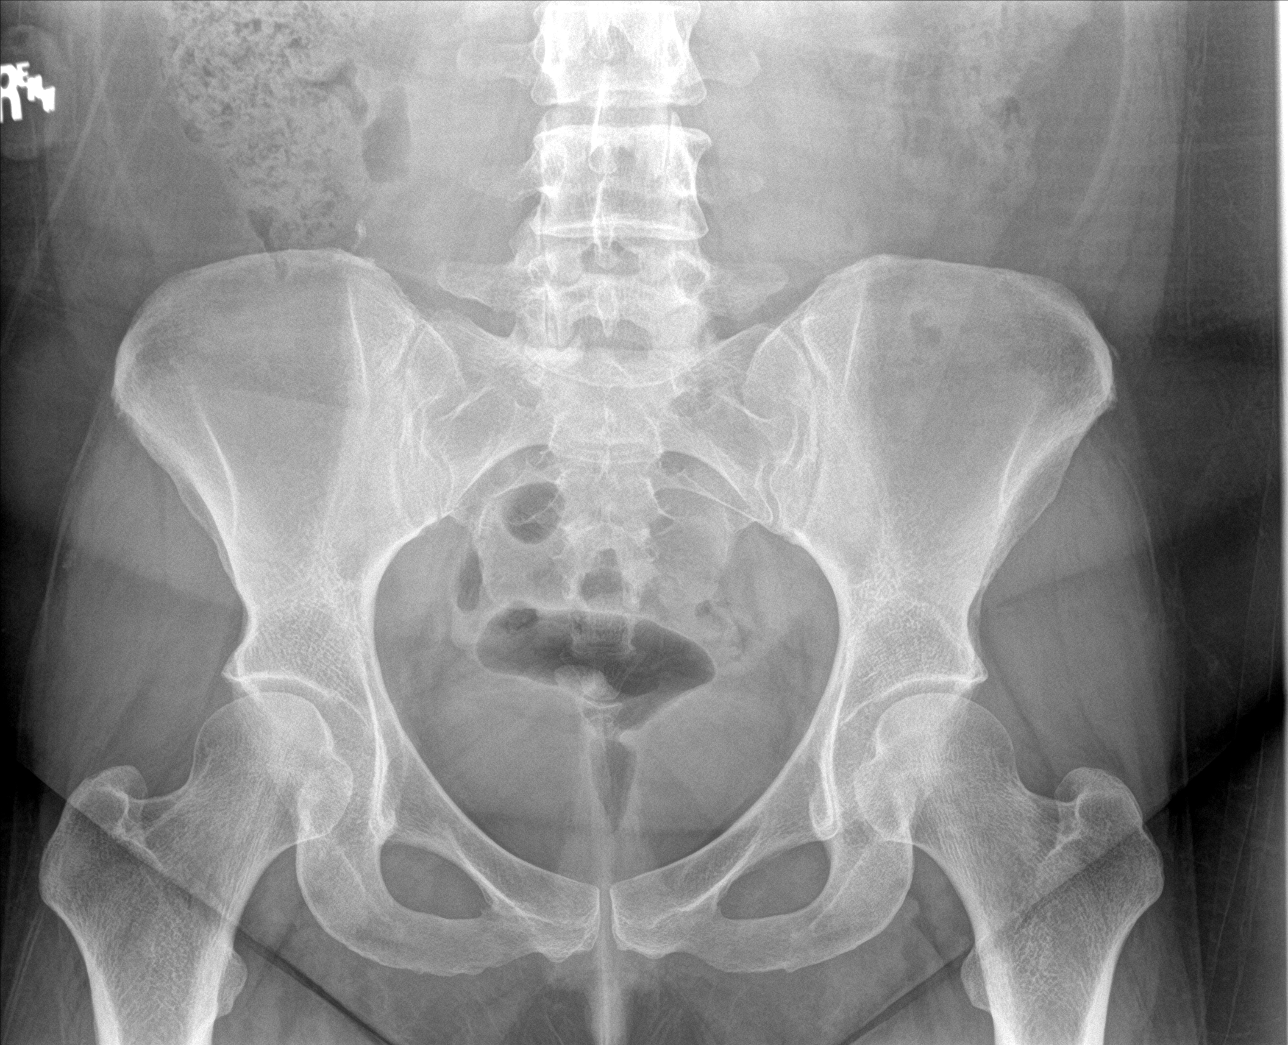

[3 of 3 positions shown; findings below may reference images not displayed]

FINDINGS: The bowel gas pattern is normal. There is no evidence of free air.
No radio-opaque calculi or other significant radiographic
abnormality is seen. Surgical staples at the tip of the cecum
consistent with previous appendectomy.
IMPRESSION: Benign-appearing abdomen.

## 2019-09-06 NOTE — Progress Notes (Signed)
Cardiology Office Note:    Date:  09/07/2019   ID:  [name], DOB 1976-01-01, MRN [id number]  PCP:  Enid Skeens., MD  Cardiologist:  Shirlee More, MD    Referring MD: Enid Skeens., MD    ASSESSMENT:    1. Chest pain of uncertain etiology   2. Syncope and collapse   3. Uncomplicated asthma, unspecified asthma severity, unspecified whether persistent   4. Hyperlipidemia, unspecified hyperlipidemia type   5. Precordial pain    PLAN:    In order of problems listed above:  1. Concerning elements probably best called atypical angina differential diagnosis CAD versus costochondral pain syndrome plan as outlined if high-sensitivity troponins elevated we will need to evaluate her as ACS 2. Had one episode in the bathroom with severe abdominal pain where she had a brief fainting episode she has had a previous monitor performed at home think we have to repeat an arrhythmia evaluation 3. Stable asthma continues with bronchodilator as needed 4. For now encouraged to remain on her statin   Next appointment: 6 weeks   Medication Adjustments/Labs and Tests Ordered: Current medicines are reviewed at length with the patient today.  Concerns regarding medicines are outlined above.  No orders of the defined types were placed in this encounter.  No orders of the defined types were placed in this encounter.   Chief Complaint  Patient presents with  . Chest Pain  . Loss of Consciousness    week ago Saturday    History of Present Illness:    [name] is a 44 y.o. female with a hx of sinus tachycardia and palpitation last seen 05/20/2018.  She utilized a heart rhythm monitor for 14 days November 2019 that showed rare ventricular and supraventricular ectopy.  Her triggered events were predominantly associated with sinus rhythm and sinus tachycardia and rarely associated two instances of event with an isolated PVC.  No medications affecting heart rhythm including a  bronchodilator and Reglan. Compliance with diet, lifestyle and medications: Yes  For the last month she has been having chest pain around her fingers received retention.  It is not exertional although it is relieved usually with 5 to 7 minutes with bowel rest and describes it as pressure to the precordium and one severe episode radiate up into her neck and left jaw.  She has extensive cervical spine and lumbar spine disc disease has asthma but has not been wheezing used inhaler like 2 times a week and has not had any GI symptoms.  Her cardiovascular risk factors include cigarette smoking and recently recognized hyperlipidemia with a total cholesterol 233 (labs and she was started on atorvastatin.  She is seen today her EKG is normal and has marked chest wall tenderness along the costochondral junction bilaterally.  To further evaluate her symptoms she will have a high-sensitivity troponin if that is normal be set up for an outpatient cardiac CTA.  In the interim I placed her on a nonsteroidal anti-inflammatory drug Celebrex and I will see her back in the office in 6 weeks.  She has had no chest wall trauma and is not doing heavy repetitive lifting.  She is not short of breath and the symptoms are not pleuritic. Past Medical History:  Diagnosis Date  . 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 DDD (degenerative disc disease), lumbar/ facet arthritis  02/05/2017  . Family history of Crohn's disease 02/05/2017   In her father and grandmother.  . Fibromyalgia 01/2017  .  Headache(784.0)    Migraines  . History of anxiety 02/05/2017   on Xanax  . History of cholecystectomy 02/05/2017  . History of colon polyps 02/05/2017  . History of cystitis (IC) 02/05/2017  . History of fibromyalgia 02/05/2017  . History of gastroesophageal reflux (GERD) 02/05/2017  . History of IBS 02/05/2017  . History of migraine 02/05/2017   on Topomax  . Midline low back pain with sciatica 02/09/2015  . Migraine 03/15/2015  . Numbness and  tingling of left leg 02/09/2015  . Osteoarthritis 01/2017  . Palpitation 03/30/2018    Past Surgical History:  Procedure Laterality Date  . ABDOMINAL HYSTERECTOMY  2010  . APPENDECTOMY  7/12  . cesearean   '96 '00  . DIAGNOSTIC LAPAROSCOPY  2001  . LAPAROSCOPY  04/26/2011   Procedure: LAPAROSCOPY OPERATIVE;  Surgeon: Roselle Locus II;  Location: [facility];  Service: Gynecology;  Laterality: N/A;  ablation of endometriosis  . SALPINGOOPHORECTOMY  04/26/2011   Procedure: SALPINGO OOPHERECTOMY;  Surgeon: Roselle Locus II;  Location: [facility];  Service: Gynecology;  Laterality: Left;    Current Medications: Current Meds  Medication Sig  . albuterol (VENTOLIN HFA) 108 (90 Base) MCG/ACT inhaler Inhale into the lungs every 6 (six) hours as needed for wheezing or shortness of breath.  . ALPRAZolam (XANAX) 0.5 MG tablet Take 0.5 mg by mouth 3 (three) times daily as needed for anxiety.   Marland Kitchen atorvastatin (LIPITOR) 10 MG tablet Take 10 mg by mouth at bedtime.  Marland Kitchen HYDROcodone-acetaminophen (NORCO) 7.5-325 MG tablet Take 1 tablet by mouth every 6 (six) hours as needed for moderate pain.  Marland Kitchen ibuprofen (ADVIL,MOTRIN) 200 MG tablet Take 600 mg by mouth every 6 (six) hours as needed.   . lidocaine (LIDODERM) 5 % Place 1 patch onto the skin daily.  . metoCLOPramide (REGLAN) 10 MG tablet Take 1 tablet (10 mg total) by mouth 4 (four) times daily -  before meals and at bedtime. Take for 2 weeks.  Marland Kitchen omeprazole (PRILOSEC) 40 MG capsule Take 40 mg by mouth daily.  . Probiotic Product (PROBIOTIC PO) Take 1 tablet by mouth daily.   Marland Kitchen topiramate (TOPAMAX) 25 MG tablet Take 3 tablets by mouth 2 (two) times daily.  . [DISCONTINUED] HYDROcodone-acetaminophen (NORCO/VICODIN) 5-325 MG tablet Take 1 tablet by mouth every 8 (eight) hours as needed.   Current Facility-Administered Medications for the 09/07/19 encounter (Office Visit) with Baldo Daub, MD  Medication  . 0.9 %  sodium chloride infusion     Allergies:    Patient has no known allergies.   Social History   Socioeconomic History  . Marital status: Divorced    Spouse name: Not on file  . Number of children: Not on file  . Years of education: Not on file  . Highest education level: Not on file  Occupational History  . Not on file  Tobacco Use  . Smoking status: Current Every Day Smoker    Packs/day: 0.50  . Smokeless tobacco: Never Used  Substance and Sexual Activity  . Alcohol use: Not Currently  . Drug use: No  . Sexual activity: Not Currently  Other Topics Concern  . Not on file  Social History Narrative  . Not on file   Social Determinants of Health   Financial Resource Strain:   . Difficulty of Paying Living Expenses:   Food Insecurity:   . Worried About Programme researcher, broadcasting/film/video in the Last Year:   . [employer] of Food in the  Last Year:   Transportation Needs:   . Freight forwarder (Medical):   Marland Kitchen Lack of Transportation (Non-Medical):   Physical Activity:   . Days of Exercise per Week:   . Minutes of Exercise per Session:   Stress:   . Feeling of Stress :   Social Connections:   . Frequency of Communication with Friends and Family:   . Frequency of Social Gatherings with Friends and Family:   . Attends Religious Services:   . Active Member of Clubs or Organizations:   . Attends Banker Meetings:   Marland Kitchen Marital Status:      Family History: The patient's family history includes Brain cancer (age of onset: 11) in her father; Diabetes in her maternal grandfather and mother; Heart disease in her maternal grandfather; High blood pressure in her mother; Lung cancer (age of onset: 26) in her father; Lupus in her maternal grandmother; Lymphoma in her maternal grandmother. ROS:   Please see the history of present illness.    All other systems reviewed and are negative.  EKGs/Labs/Other Studies Reviewed:    The following studies were reviewed today:  EKG:  EKG ordered today and personally reviewed.  The ekg  ordered today demonstrates sinus rhythm and remains normal  Recent Labs: No results found for requested labs within last 8760 hours.  Recent Lipid Panel No results found for: CHOL, TRIG, HDL, CHOLHDL, VLDL, LDLCALC, LDLDIRECT  Physical Exam:    VS:  BP 130/90 (BP Location: Right Arm, Patient Position: Sitting, Cuff Size: Normal)   Pulse 90   Ht 5\' 6"  (1.676 m)   Wt 186 lb (84.4 kg)   SpO2 96%   BMI 30.02 kg/m     Wt Readings from Last 3 Encounters:  09/07/19 186 lb (84.4 kg)  05/20/18 192 lb 2 oz (87.1 kg)  03/30/18 199 lb 12.8 oz (90.6 kg)     GEN:  Well nourished, well developed in no acute distress HEENT: Normal NECK: No JVD; No carotid bruits LYMPHATICS: No lymphadenopathy CARDIAC: Marked tenderness costochondral junction bilaterally reproduces much of her complaint RRR, no murmurs, rubs, gallops RESPIRATORY:  Clear to auscultation without rales, wheezing or rhonchi  ABDOMEN: Soft, non-tender, non-distended MUSCULOSKELETAL:  No edema; No deformity  SKIN: Warm and dry NEUROLOGIC:  Alert and oriented x 3 PSYCHIATRIC:  Normal affect    Signed, 04/01/18, MD  09/07/2019 2:15 PM    [facility]

## 2019-09-07 ENCOUNTER — Ambulatory Visit (INDEPENDENT_AMBULATORY_CARE_PROVIDER_SITE_OTHER): Payer: PRIVATE HEALTH INSURANCE | Admitting: Cardiology

## 2019-09-07 ENCOUNTER — Other Ambulatory Visit: Payer: Self-pay

## 2019-09-07 VITALS — BP 130/90 | HR 90 | Ht 66.0 in | Wt 186.0 lb

## 2019-09-07 DIAGNOSIS — R55 Syncope and collapse: Secondary | ICD-10-CM | POA: Diagnosis not present

## 2019-09-07 DIAGNOSIS — R079 Chest pain, unspecified: Secondary | ICD-10-CM | POA: Diagnosis not present

## 2019-09-07 DIAGNOSIS — E785 Hyperlipidemia, unspecified: Secondary | ICD-10-CM | POA: Diagnosis not present

## 2019-09-07 DIAGNOSIS — R072 Precordial pain: Secondary | ICD-10-CM

## 2019-09-07 DIAGNOSIS — J45909 Unspecified asthma, uncomplicated: Secondary | ICD-10-CM | POA: Diagnosis not present

## 2019-09-07 MED ORDER — METOPROLOL TARTRATE 100 MG PO TABS
100.0000 mg | ORAL_TABLET | Freq: Once | ORAL | 0 refills | Status: DC
Start: 2019-09-07 — End: 2019-10-27

## 2019-09-07 MED ORDER — CELECOXIB 100 MG PO CAPS
100.0000 mg | ORAL_CAPSULE | Freq: Two times a day (BID) | ORAL | 3 refills | Status: AC
Start: 2019-09-07 — End: ?

## 2019-09-07 NOTE — Patient Instructions (Signed)
Medication Instructions:  Your physician has recommended you make the following change in your medication:  START: Cellebrex 100 mg take one tablet by mouth twice daily.  *If you need a refill on your cardiac medications before your next appointment, please call your pharmacy*   Lab Work: Your physician recommends that you return for lab work in: TODAY Troponin, BMP, CBC If you have labs (blood work) drawn today and your tests are completely normal, you will receive your results only by: Marland Kitchen MyChart Message (if you have MyChart) OR . A paper copy in the mail If you have any lab test that is abnormal or we need to change your treatment, we will call you to review the results.   Testing/Procedures: Your cardiac CT will be scheduled at one of the below locations:   [facility] [address] [place], [place] [address] [phone]   If scheduled at [facility], please arrive at the [facility] main entrance of [facility] 30 minutes prior to test start time. Proceed to the [facility] Radiology Department (first floor) to check-in and test prep.  Please follow these instructions carefully (unless otherwise directed):  On the Night Before the Test: . Be sure to Drink plenty of water. . Do not consume any caffeinated/decaffeinated beverages or chocolate 12 hours prior to your test. . Do not take any antihistamines 12 hours prior to your test.  On the Day of the Test: . Drink plenty of water. Do not drink any water within one hour of the test. . Do not eat any food 4 hours prior to the test. . You may take your regular medications prior to the test.  . Take metoprolol (Lopressor) two hours prior to test. . FEMALES- please wear underwire-free bra if available   After the Test: . Drink plenty of water. . After receiving IV contrast, you may experience a mild flushed feeling. This is normal. . On occasion, you may experience a mild rash up to 24 hours  after the test. This is not dangerous. If this occurs, you can take Benadryl 25 mg and increase your fluid intake. . If you experience trouble breathing, this can be serious. If it is severe call 911 IMMEDIATELY. If it is mild, please call our office. . If you take any of these medications: Glipizide/Metformin, Avandament, Glucavance, please do not take 48 hours after completing test unless otherwise instructed.   Once we have confirmed authorization from your insurance company, we will call you to set up a date and time for your test.   For non-scheduling related questions, please contact the cardiac imaging nurse navigator should you have any questions/concerns: Rockwell Alexandria, RN Navigator Cardiac Imaging [facility] [phone] office  For scheduling needs, including cancellations and rescheduling, please call [phone].      Follow-Up: At [facility], you and your health needs are our priority.  As part of our continuing mission to provide you with exceptional heart care, we have created designated Provider Care Teams.  These Care Teams include your primary Cardiologist (physician) and Advanced Practice Providers (APPs -  Physician Assistants and Nurse Practitioners) who all work together to provide you with the care you need, when you need it.  We recommend signing up for the patient portal called "MyChart".  Sign up information is provided on this After Visit Summary.  MyChart is used to connect with patients for Virtual Visits (Telemedicine).  Patients are able to view lab/test  results, encounter notes, upcoming appointments, etc.  Non-urgent messages can be sent to your provider as well.   To learn more about what you can do with MyChart, go to [email or web address].    Your next appointment:   6 week(s)  The format for your next appointment:   In Person  Provider:   Shirlee More, MD   Other Instructions

## 2019-09-08 ENCOUNTER — Telehealth: Payer: Self-pay

## 2019-09-08 LAB — BASIC METABOLIC PANEL
BUN/Creatinine Ratio: 13 (ref 9–23)
BUN: 16 mg/dL (ref 6–24)
CO2: 22 mmol/L (ref 20–29)
Calcium: 9.2 mg/dL (ref 8.7–10.2)
Chloride: 98 mmol/L (ref 96–106)
Creatinine, Ser: 1.28 mg/dL — ABNORMAL HIGH (ref 0.57–1.00)
GFR calc Af Amer: 59 mL/min/{1.73_m2} — ABNORMAL LOW (ref >59)
GFR calc non Af Amer: 51 mL/min/{1.73_m2} — ABNORMAL LOW (ref >59)
Glucose: 75 mg/dL (ref 65–99)
Potassium: 4.7 mmol/L (ref 3.5–5.2)
Sodium: 131 mmol/L — ABNORMAL LOW (ref 134–144)

## 2019-09-08 LAB — CBC
Hematocrit: 33.9 % — ABNORMAL LOW (ref 34.0–46.6)
Hemoglobin: 11.3 g/dL (ref 11.1–15.9)
MCH: 32.4 pg (ref 26.6–33.0)
MCHC: 33.3 g/dL (ref 31.5–35.7)
MCV: 97 fL (ref 79–97)
Platelets: 418 10*3/uL (ref 150–450)
RBC: 3.49 x10E6/uL — ABNORMAL LOW (ref 3.77–5.28)
RDW: 12.4 % (ref 11.7–15.4)
WBC: 10.9 10*3/uL — ABNORMAL HIGH (ref 3.4–10.8)

## 2019-09-08 LAB — TROPONIN T: Troponin T (Highly Sensitive): <6 ng/L (ref 0–14)

## 2019-09-08 NOTE — Telephone Encounter (Signed)
-----   Message from Baldo Daub, MD sent at 09/08/2019  7:55 AM EDT ----- Normal or stable result  No changes

## 2019-09-08 NOTE — Telephone Encounter (Signed)
Spoke with patient regarding results and recommendation.  Patient verbalizes understanding and is agreeable to plan of care. Advised patient to call back with any issues or concerns.  

## 2019-10-13 ENCOUNTER — Telehealth (HOSPITAL_COMMUNITY): Payer: Self-pay | Admitting: *Deleted

## 2019-10-13 NOTE — Telephone Encounter (Signed)
Attempted to call patient regarding upcoming cardiac CT appointment. Left message on voicemail with name and callback number  Korrin Waterfield Tai RN Navigator Cardiac Imaging [facility] [phone] Office [phone] Cell  

## 2019-10-13 NOTE — Telephone Encounter (Signed)
Pt returning call regarding upcoming cardiac imaging study; pt verbalizes understanding of appt date/time, parking situation and where to check in, pre-test NPO status and medications ordered, and verified current allergies; name and call back number provided for further questions should they arise  Falon Huesca Tai RN Navigator Cardiac Imaging [facility] [phone] office [phone] cell  

## 2019-10-14 ENCOUNTER — Other Ambulatory Visit: Payer: Self-pay

## 2019-10-14 ENCOUNTER — Ambulatory Visit (HOSPITAL_COMMUNITY)
Admission: RE | Admit: 2019-10-14 | Discharge: 2019-10-14 | Disposition: A | Discharge status: Home / Self Care | Payer: PRIVATE HEALTH INSURANCE | Source: Ambulatory Visit | Attending: Cardiology | Admitting: Cardiology

## 2019-10-14 DIAGNOSIS — R072 Precordial pain: Secondary | ICD-10-CM | POA: Insufficient documentation

## 2019-10-14 MED ORDER — IOHEXOL 350 MG/ML SOLN
80.0000 mL | Freq: Once | INTRAVENOUS | Status: AC | PRN
  Administered 2019-10-14: 80 mL via INTRAVENOUS

## 2019-10-14 MED ORDER — NITROGLYCERIN 0.4 MG SL SUBL
0.8000 mg | SUBLINGUAL_TABLET | Freq: Once | SUBLINGUAL | Status: AC
  Administered 2019-10-14: 0.8 mg via SUBLINGUAL

## 2019-10-14 MED ORDER — METOPROLOL TARTRATE 5 MG/5ML IV SOLN: 5.0000 mg | INTRAVENOUS | Status: DC | PRN

## 2019-10-14 MED ORDER — NITROGLYCERIN 0.4 MG SL SUBL
SUBLINGUAL_TABLET | SUBLINGUAL | Status: AC
  Filled 2019-10-14: qty 2

## 2019-10-15 ENCOUNTER — Telehealth: Payer: Self-pay

## 2019-10-15 NOTE — Telephone Encounter (Signed)
Left message on patients voicemail to please return our call.   

## 2019-10-15 NOTE — Telephone Encounter (Signed)
Spoke with patient regarding results and recommendation.  Patient verbalizes understanding and is agreeable to plan of care. Advised patient to call back with any issues or concerns.  

## 2019-10-19 ENCOUNTER — Ambulatory Visit: Payer: Self-pay | Admitting: Cardiology

## 2019-10-25 ENCOUNTER — Other Ambulatory Visit: Payer: Self-pay

## 2019-10-27 ENCOUNTER — Ambulatory Visit (INDEPENDENT_AMBULATORY_CARE_PROVIDER_SITE_OTHER): Payer: PRIVATE HEALTH INSURANCE | Admitting: Cardiology

## 2019-10-27 ENCOUNTER — Other Ambulatory Visit: Payer: Self-pay

## 2019-10-27 ENCOUNTER — Encounter: Payer: Self-pay | Admitting: Cardiology

## 2019-10-27 VITALS — BP 130/82 | HR 88 | Ht 66.0 in | Wt 195.2 lb

## 2019-10-27 DIAGNOSIS — R079 Chest pain, unspecified: Secondary | ICD-10-CM | POA: Diagnosis not present

## 2019-10-27 DIAGNOSIS — E785 Hyperlipidemia, unspecified: Secondary | ICD-10-CM

## 2019-10-27 DIAGNOSIS — R55 Syncope and collapse: Secondary | ICD-10-CM

## 2019-10-27 NOTE — Patient Instructions (Signed)
Medication Instructions:  Your physician recommends that you continue on your current medications as directed. Please refer to the Current Medication list given to you today.  Please use your Celebrex only as needed.  *If you need a refill on your cardiac medications before your next appointment, please call your pharmacy*   Lab Work: None If you have labs (blood work) drawn today and your tests are completely normal, you will receive your results only by: Marland Kitchen MyChart Message (if you have MyChart) OR . A paper copy in the mail If you have any lab test that is abnormal or we need to change your treatment, we will call you to review the results.   Testing/Procedures: None   Follow-Up: At [facility], you and your health needs are our priority.  As part of our continuing mission to provide you with exceptional heart care, we have created designated Provider Care Teams.  These Care Teams include your primary Cardiologist (physician) and Advanced Practice Providers (APPs -  Physician Assistants and Nurse Practitioners) who all work together to provide you with the care you need, when you need it.  We recommend signing up for the patient portal called "MyChart".  Sign up information is provided on this After Visit Summary.  MyChart is used to connect with patients for Virtual Visits (Telemedicine).  Patients are able to view lab/test results, encounter notes, upcoming appointments, etc.  Non-urgent messages can be sent to your provider as well.   To learn more about what you can do with MyChart, go to [email or web address].    Your next appointment:   As needed  The format for your next appointment:   In Person  Provider:   Norman Herrlich, MD   Other Instructions

## 2019-10-27 NOTE — Progress Notes (Signed)
Cardiology Office Note:    Date:  10/27/2019   ID:  [name], DOB 03-23-1976, MRN [id number]  PCP:  Nonnie Done., MD  Cardiologist:  Norman Herrlich, MD    Referring MD: Nonnie Done., MD    ASSESSMENT:    1. Chest pain of uncertain etiology   2. Syncope and collapse   3. Hyperlipidemia, unspecified hyperlipidemia type    PLAN:    In order of problems listed above:  1. Clinically she had costochondritis improved will take Celebrex as needed.  She is relieved with a normal CTA and calcium score of 0 2. Improved no recurrence or tachycardias lessened since she stopped smoking 3. Continue her statin with thoracic atherosclerosis   Next appointment: As needed   Medication Adjustments/Labs and Tests Ordered: Current medicines are reviewed at length with the patient today.  Concerns regarding medicines are outlined above.  No orders of the defined types were placed in this encounter.  No orders of the defined types were placed in this encounter.   No chief complaint on file.   History of Present Illness:    [name] is a 44 y.o. female with a hx of palpitation syncope and chest pain last seen 09/07/2019. Compliance with diet, lifestyle and medications: Yes  Denies opportunity to review the results of her cardiac CTA in terms of coronary disease her score is 0 she has no coronary artery disease and is relieved.  She had gone ahead and read the report on her own online was concerned about possible PFO I told her that it is not uncommon its incidental and I think it poses any clinical concerns for her.  She did have mild thoracic atherosclerosis and chooses to remain on a statin for hyperlipidemia.  She continues to have chest pain but is markedly improved with Celebrex and she will take it in the future as needed for costochondritis.  She underwent a cardiac CTA 10/14/2019 which showed a calcium score of 0 no CAD and incidental finding of possible small patent  foramen ovale.  Previous evaluation 14-day extended ZIO monitor November 2019 showed rare ventricular and supraventricular ectopy and her triggered and diary events generally unassociated with arrhythmia.  At the time of her office visit high-sensitivity troponin was performed and normal. Past Medical History:  Diagnosis Date  . 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 DDD (degenerative disc disease), lumbar/ facet arthritis  02/05/2017  . Family history of Crohn's disease 02/05/2017   In her father and grandmother.  . Fibromyalgia 01/2017  . Headache(784.0)    Migraines  . History of anxiety 02/05/2017   on Xanax  . History of cholecystectomy 02/05/2017  . History of colon polyps 02/05/2017  . History of cystitis (IC) 02/05/2017  . History of fibromyalgia 02/05/2017  . History of gastroesophageal reflux (GERD) 02/05/2017  . History of IBS 02/05/2017  . History of migraine 02/05/2017   on Topomax  . Midline low back pain with sciatica 02/09/2015  . Migraine 03/15/2015  . Numbness and tingling of left leg 02/09/2015  . Osteoarthritis 01/2017  . Palpitation 03/30/2018    Past Surgical History:  Procedure Laterality Date  . ABDOMINAL HYSTERECTOMY  2010  . APPENDECTOMY  7/12  . cesearean   '96 '00  . DIAGNOSTIC LAPAROSCOPY  2001  . LAPAROSCOPY  04/26/2011   Procedure: LAPAROSCOPY OPERATIVE;  Surgeon: Roselle Locus II;  Location: [facility];  Service: Gynecology;  Laterality: N/A;  ablation of endometriosis  .  SALPINGOOPHORECTOMY  04/26/2011   Procedure: SALPINGO OOPHERECTOMY;  Surgeon: Roselle Locus II;  Location: [facility];  Service: Gynecology;  Laterality: Left;    Current Medications: Current Meds  Medication Sig  . albuterol (VENTOLIN HFA) 108 (90 Base) MCG/ACT inhaler Inhale into the lungs every 6 (six) hours as needed for wheezing or shortness of breath.  Marland Kitchen atorvastatin (LIPITOR) 10 MG tablet Take 10 mg by mouth at bedtime.  . celecoxib (CELEBREX) 100 MG capsule Take 1 capsule (100 mg total)  by mouth 2 (two) times daily.  Marland Kitchen HYDROcodone-acetaminophen (NORCO) 7.5-325 MG tablet Take 1 tablet by mouth every 6 (six) hours as needed for moderate pain.  . hydrOXYzine (ATARAX/VISTARIL) 25 MG tablet Take 25 mg by mouth at bedtime.  . lidocaine (LIDODERM) 5 % Place 1 patch onto the skin daily.  . metoCLOPramide (REGLAN) 10 MG tablet Take 1 tablet (10 mg total) by mouth 4 (four) times daily -  before meals and at bedtime. Take for 2 weeks.  Marland Kitchen omeprazole (PRILOSEC) 40 MG capsule Take 40 mg by mouth daily.  . Probiotic Product (PROBIOTIC PO) Take 1 tablet by mouth daily.   Marland Kitchen topiramate (TOPAMAX) 25 MG tablet Take 3 tablets by mouth 2 (two) times daily.  . [DISCONTINUED] ibuprofen (ADVIL,MOTRIN) 200 MG tablet Take 600 mg by mouth every 6 (six) hours as needed.    Current Facility-Administered Medications for the 10/27/19 encounter (Office Visit) with Baldo Daub, MD  Medication  . 0.9 %  sodium chloride infusion     Allergies:   Patient has no known allergies.   Social History   Socioeconomic History  . Marital status: Divorced    Spouse name: Not on file  . Number of children: Not on file  . Years of education: Not on file  . Highest education level: Not on file  Occupational History  . Not on file  Tobacco Use  . Smoking status: Current Every Day Smoker    Packs/day: 0.50  . Smokeless tobacco: Never Used  Vaping Use  . Vaping Use: Never used  Substance and Sexual Activity  . Alcohol use: Not Currently  . Drug use: No  . Sexual activity: Not Currently  Other Topics Concern  . Not on file  Social History Narrative  . Not on file   Social Determinants of Health   Financial Resource Strain:   . Difficulty of Paying Living Expenses:   Food Insecurity:   . Worried About Programme researcher, broadcasting/film/video in the Last Year:   . Barista in the Last Year:   Transportation Needs:   . Freight forwarder (Medical):   Marland Kitchen Lack of Transportation (Non-Medical):   Physical Activity:    . Days of Exercise per Week:   . Minutes of Exercise per Session:   Stress:   . Feeling of Stress :   Social Connections:   . Frequency of Communication with Friends and Family:   . Frequency of Social Gatherings with Friends and Family:   . Attends Religious Services:   . Active Member of Clubs or Organizations:   . Attends Banker Meetings:   Marland Kitchen Marital Status:      Family History: The patient's family history includes Brain cancer (age of onset: 97) in her father; Diabetes in her maternal grandfather and mother; Heart disease in her maternal grandfather; High blood pressure in her mother; Lung cancer (age of onset: 19) in her father; Lupus in her maternal grandmother; Lymphoma  in her maternal grandmother. ROS:   Please see the history of present illness.    All other systems reviewed and are negative.  EKGs/Labs/Other Studies Reviewed:    The following studies were reviewed today:   Recent Labs: 09/07/2019: BUN 16; Creatinine, Ser 1.28; Hemoglobin 11.3; Platelets 418; Potassium 4.7; Sodium 131  Recent Lipid Panel No results found for: CHOL, TRIG, HDL, CHOLHDL, VLDL, LDLCALC, LDLDIRECT  Physical Exam:    VS:  BP 130/82   Pulse 88   Ht 5\' 6"  (1.676 m)   Wt 195 lb 3.2 oz (88.5 kg)   SpO2 98%   BMI 31.51 kg/m     Wt Readings from Last 3 Encounters:  10/27/19 195 lb 3.2 oz (88.5 kg)  09/07/19 186 lb (84.4 kg)  05/20/18 192 lb 2 oz (87.1 kg)     GEN:  Well nourished, well developed in no acute distress HEENT: Normal NECK: No JVD; No carotid bruits LYMPHATICS: No lymphadenopathy CARDIAC: Mild CCTA tenderness bilaterally RRR, no murmurs, rubs, gallops RESPIRATORY:  Clear to auscultation without rales, wheezing or rhonchi  ABDOMEN: Soft, non-tender, non-distended MUSCULOSKELETAL:  No edema; No deformity  SKIN: Warm and dry NEUROLOGIC:  Alert and oriented x 3 PSYCHIATRIC:  Normal affect    Signed, Shirlee More, MD  10/27/2019 4:20 PM    [facility]

## 2022-07-24 ENCOUNTER — Encounter: Payer: Self-pay | Admitting: Physician Assistant

## 2022-08-29 ENCOUNTER — Encounter: Payer: Self-pay | Admitting: *Deleted

## 2022-09-05 ENCOUNTER — Encounter: Payer: Self-pay | Admitting: Physician Assistant

## 2022-09-05 ENCOUNTER — Other Ambulatory Visit: Payer: Self-pay | Admitting: Physician Assistant

## 2022-09-05 ENCOUNTER — Ambulatory Visit (INDEPENDENT_AMBULATORY_CARE_PROVIDER_SITE_OTHER): Payer: PRIVATE HEALTH INSURANCE | Admitting: Physician Assistant

## 2022-09-05 VITALS — BP 118/68 | HR 77 | Ht 66.0 in | Wt 138.0 lb

## 2022-09-05 DIAGNOSIS — R110 Nausea: Secondary | ICD-10-CM

## 2022-09-05 DIAGNOSIS — Z86010 Personal history of colonic polyps: Secondary | ICD-10-CM

## 2022-09-05 DIAGNOSIS — K5909 Other constipation: Secondary | ICD-10-CM

## 2022-09-05 MED ORDER — NALOXEGOL OXALATE 25 MG PO TABS: 25.0000 mg | ORAL_TABLET | Freq: Every day | ORAL | 5 refills | Status: AC

## 2022-09-05 MED ORDER — CLENPIQ 10-3.5-12 MG-GM -GM/160ML PO SOLN: 1.0000 | ORAL | 0 refills | Status: AC

## 2022-09-05 MED ORDER — METOCLOPRAMIDE HCL 10 MG PO TABS: 10.0000 mg | ORAL_TABLET | ORAL | 0 refills | Status: AC

## 2022-09-05 NOTE — Patient Instructions (Addendum)
You have been scheduled for an endoscopy and colonoscopy. Please follow the written instructions given to you at your visit today. Please pick up your prep supplies at the pharmacy within the next 1-3 days. If you use inhalers (even only as needed), please bring them with you on the day of your procedure.  We have sent the following medications to your pharmacy for you to pick up at your convenience: Movantik Reglan 10 mg (use with prep)  _______________________________________________________  If your blood pressure at your visit was 140/90 or greater, please contact your primary care physician to follow up on this.  _______________________________________________________  If you are age 24 or older, your body mass index should be between 23-30. Your Body mass index is 22.27 kg/m. If this is out of the aforementioned range listed, please consider follow up with your Primary Care Provider.  If you are age 26 or younger, your body mass index should be between 19-25. Your Body mass index is 22.27 kg/m. If this is out of the aformentioned range listed, please consider follow up with your Primary Care Provider.   ________________________________________________________  The [facility] providers would like to encourage you to use [facility] to communicate with providers for non-urgent requests or questions.  Due to long hold times on the telephone, sending your provider a message by [facility] may be a faster and more efficient way to get a response.  Please allow 48 business hours for a response.  Please remember that this is for non-urgent requests.  _______________________________________________________  Due to recent changes in healthcare laws, you may see the results of your imaging and laboratory studies on MyChart before your provider has had a chance to review them.  We understand that in some cases there may be results that are confusing or concerning to you. Not all laboratory results come  back in the same time frame and the provider may be waiting for multiple results in order to interpret others.  Please give [place] 48 hours in order for your provider to thoroughly review all the results before contacting the office for clarification of your results.

## 2022-09-05 NOTE — Progress Notes (Signed)
Chief Complaint: Discuss colonoscopy and nausea  HPI:    [name] is a 47 year old female with a past medical history as listed below, known to Dr. Chales Abrahams, who was referred to me by Egbert Garibaldi Excell Seltzer., MD for consideration of a colonoscopy and nausea.    09/01/2017 colonoscopy with grade 1 hemorrhoids and a few rare small diverticula in the sigmoid colon otherwise normal.  At that time recommended repeat in 5 years given that she has significant tubular adenomas at a younger age.    09/01/2017 EGD with minimal gastritis and otherwise normal.  Patient told to stop smoking.    03/09/2018 office visit with Dr. Chales Abrahams for epigastric pain with nausea.  Discussed negative EGD/colon 08/2017, ultrasound, HIDA with EF 2019 negative except for pain after Ensure ingestion, solid-phase GES 7/19, CT also normal and labs.  Discussed IBS with predominant constipation.  Recommended to follow a gluten-free diet and change from Omeprazole to Protonix 40 mg p.o. daily and given a trial of Motegrity.  Recommended MiraLAX as needed.    04/16/2022 CBC, BMP, vitamin D normal.    Today, the patient tells me that she is followed with Dr. Chales Abrahams for years.  Apparently after seeing him last she notes that when she was having all the nausea she had just quit smoking she was perimenopausal and had gained a lot of weight which made her slightly depressed.  Also had back surgery and could not move like she wanted to, due to this she was depressed and felt like she was constipated excetra.  She eventually went on Optavia and lost around 93 pounds and after she lost all that weight, about 3 months into the program she had no further GI issues and was having regular bowel habits with no nausea at all.  She had come off of all of her GI meds.      Most recently over the past few months she is now trying to get back to living and adding back in some normal foods into her diet but now that she has done that she is having return of a lot of her  GI issues including constipation.  Tells me she still has a bowel movement every day or every other day but sometimes it is just balls and she really feels like she cannot empty out.  Recalls that last night she was up all night feeling like she had to "have a baby", tells me anytime she would even sit to urinate she felt like she needed to have more stool.  Along with that she has noticed increase in her nausea over the past 3 months.  Patient is still on chronic Hydrocodone managed by the pain clinic for her back pain.  She is not on anything chronically for constipation but has tried a lot of stool softeners excetra lately.    Also tells me she is due for a surveillance colonoscopy.    Denies fever, chills, blood in her stool, vomiting or symptoms that awaken her from sleep.    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   Fibromyalgia 01/2017   Headache(784.0)    Migraines   Hemorrhoids    History of anxiety 02/05/2017   on Xanax   History of cholecystectomy 02/05/2017   History of colon  polyps 02/05/2017   History of cystitis (IC) 02/05/2017   History of fibromyalgia 02/05/2017   History of gastroesophageal reflux (GERD) 02/05/2017   History of IBS 02/05/2017   History of migraine 02/05/2017   on Topomax   Midline low back pain with sciatica 02/09/2015   Migraine 03/15/2015   Numbness and tingling of left leg 02/09/2015   Osteoarthritis 01/2017   Palpitation 03/30/2018    Past Surgical History:  Procedure Laterality Date   ABDOMINAL HYSTERECTOMY  2010   APPENDECTOMY  7/12   cesearean   '96 '00   DIAGNOSTIC LAPAROSCOPY  2001   LAPAROSCOPY  04/26/2011   Procedure: LAPAROSCOPY OPERATIVE;  Surgeon: Roselle Locus II;  Location: [facility];  Service: Gynecology;  Laterality: N/A;   ablation of endometriosis   SALPINGOOPHORECTOMY  04/26/2011   Procedure: SALPINGO OOPHERECTOMY;  Surgeon: Roselle Locus II;  Location: [facility];  Service: Gynecology;  Laterality: Left;    Current Outpatient Medications  Medication Sig Dispense Refill   albuterol (VENTOLIN HFA) 108 (90 Base) MCG/ACT inhaler Inhale into the lungs every 6 (six) hours as needed for wheezing or shortness of breath.     atorvastatin (LIPITOR) 10 MG tablet Take 10 mg by mouth at bedtime.     celecoxib (CELEBREX) 100 MG capsule Take 1 capsule (100 mg total) by mouth 2 (two) times daily. 30 capsule 3   HYDROcodone-acetaminophen (NORCO) 7.5-325 MG tablet Take 1 tablet by mouth every 6 (six) hours as needed for moderate pain.     hydrOXYzine (ATARAX/VISTARIL) 25 MG tablet Take 25 mg by mouth at bedtime.     lidocaine (LIDODERM) 5 % Place 1 patch onto the skin daily.     metoCLOPramide (REGLAN) 10 MG tablet Take 1 tablet (10 mg total) by mouth 4 (four) times daily -  before meals and at bedtime. Take for 2 weeks. 56 tablet 2   omeprazole (PRILOSEC) 40 MG capsule Take 40 mg by mouth daily.     Probiotic Product (PROBIOTIC PO) Take 1 tablet by mouth daily.      topiramate (TOPAMAX) 25 MG tablet Take 3 tablets by mouth 2 (two) times daily.     Current Facility-Administered Medications  Medication Dose Route Frequency Provider Last Rate Last Admin   0.9 %  sodium chloride infusion  500 mL Intravenous Once Lynann Bologna, MD        Allergies as of 09/05/2022   (No Known Allergies)    Family History  Problem Relation Age of Onset   Lung cancer Father 53   Brain cancer Father 50   High blood pressure Mother    Diabetes Mother    Lupus Maternal Grandmother    Lymphoma Maternal Grandmother    Heart disease Maternal Grandfather    Diabetes Maternal Grandfather     Social History   Socioeconomic History   Marital status: Divorced    Spouse name: Not on file   Number of children: Not on file   Years of  education: Not on file   Highest education level: Not on file  Occupational History   Not on file  Tobacco Use   Smoking status: Every Day    Packs/day: .5    Types: Cigarettes   Smokeless tobacco: Never  Vaping Use   Vaping Use: Never used  Substance and Sexual Activity   Alcohol use: Not Currently   Drug use: No   Sexual activity: Not Currently  Other Topics Concern   Not on file  Social  History Narrative   Not on file   Social Determinants of Health   Financial Resource Strain: Not on file  Food Insecurity: Not on file  Transportation Needs: Not on file  Physical Activity: Not on file  Stress: Not on file  Social Connections: Not on file  Intimate Partner Violence: Not on file    Review of Systems:    Constitutional: No weight loss, fever or chills Skin: No rash  Cardiovascular: No chest pain  Respiratory: No SOB  Gastrointestinal: See HPI and otherwise negative Genitourinary: No dysuria  Neurological: No headache, dizziness or syncope Musculoskeletal: No new muscle or joint pain Hematologic: No bleeding  Psychiatric: No history of depression or anxiety   Physical Exam:  Vital signs: BP 118/68   Pulse 77   Ht 5\' 6"  (1.676 m)   Wt 138 lb (62.6 kg)   BMI 22.27 kg/m    Constitutional:   Pleasant Caucasian female appears to be in NAD, Well developed, Well nourished, alert and cooperative Head:  Normocephalic and atraumatic. Eyes:   PEERL, EOMI. No icterus. Conjunctiva pink. Ears:  Normal auditory acuity. Neck:  Supple Throat: Oral cavity and pharynx without inflammation, swelling or lesion.  Respiratory: Respirations even and unlabored. Lungs clear to auscultation bilaterally.   No wheezes, crackles, or rhonchi.  Cardiovascular: Normal S1, S2. No MRG. Regular rate and rhythm. No peripheral edema, cyanosis or pallor.  Gastrointestinal:  Soft, nondistended, mild generalized ttp. No rebound or guarding. Normal bowel sounds. No appreciable masses or  hepatomegaly. Rectal:  Not performed.  Msk:  Symmetrical without gross deformities. Without edema, no deformity or joint abnormality.  Neurologic:  Alert and  oriented x4;  grossly normal neurologically.  Skin:   Dry and intact without significant lesions or rashes. Psychiatric: Oriented to person, place and time. Demonstrates good judgement and reason without abnormal affect or behaviors.  No recent labs or imaging.  Assessment: 1.  Chronic constipation: Was better on Optavia, now with more regular diet having issues, diagnosis of IBS-seen in the past 2.  Chronic nausea: Has returned with return of constipation; likely constipation related/functional 3.  History of adenomatous polyps: Last colonoscopy in 2019, repeat colonoscopy due now  Plan: 1.  Scheduled patient for diagnostic EGD and surveillance colonoscopy with Dr. Chales Abrahams at his next available.  Did provide the patient a detailed list of risks for the procedures and she agrees to proceed. Patient is appropriate for endoscopic procedure(s) in the ambulatory ([facility]) setting.  2.  Started the patient on Movantik 25 mg once daily #30 with 5 refills. 3.  Patient will have a 2-day bowel prep for her colonoscopy 4.  Prescribed Reglan 10 mg twice, 20 to 30 minutes before first half of prep and again before the second #2 with no refills 5.  Patient to follow in clinic per recommendations from Dr. Chales Abrahams after time of procedures.  Hyacinth Meeker, PA-C [facility] 09/05/2022, 8:26 AM  Cc: Nonnie Done., MD

## 2022-09-05 NOTE — Telephone Encounter (Signed)
PA needed for [facility]

## 2022-09-12 NOTE — Progress Notes (Signed)
Agree with assessment/plan.  Raj Chistopher Mangino, MD [facility] [phone]  

## 2022-09-18 ENCOUNTER — Other Ambulatory Visit (HOSPITAL_COMMUNITY): Payer: Self-pay

## 2022-09-18 NOTE — Telephone Encounter (Signed)
Has member tried any alternatives? Preferred medication is generic amitiza 60 capsules/30 days for $10.00

## 2022-11-04 ENCOUNTER — Telehealth: Payer: Self-pay | Admitting: Physician Assistant

## 2022-11-04 MED ORDER — PLENVU 140 G PO SOLR: 1.0000 | ORAL | 0 refills | Status: AC

## 2022-11-04 NOTE — Telephone Encounter (Signed)
Patient states that the pharmacy tells her they do not have Clenpiq available. Unfortunately, patient needs a 2 day prep and her procedure is on 6/26 so it is pertinent that she have a different prep called to the pharmacy that may be in stock as there is no time to order the Clenpiq. I have advised patient I will send Plenvu to the pharmacy and that I have sent her updated instructions in mychart to reflect the change in prep. Patient verbalizes understanding.

## 2022-11-04 NOTE — Telephone Encounter (Signed)
Patient states prep medication is out of stock and would like to be prescribed something else. Please advise.   Thank you

## 2022-11-06 ENCOUNTER — Ambulatory Visit (AMBULATORY_SURGERY_CENTER): Payer: PRIVATE HEALTH INSURANCE | Admitting: Gastroenterology

## 2022-11-06 ENCOUNTER — Encounter: Payer: Self-pay | Admitting: Gastroenterology

## 2022-11-06 VITALS — BP 114/70 | HR 52 | Temp 98.4°F | Resp 14 | Ht 66.0 in | Wt 138.0 lb

## 2022-11-06 DIAGNOSIS — Z09 Encounter for follow-up examination after completed treatment for conditions other than malignant neoplasm: Secondary | ICD-10-CM | POA: Diagnosis not present

## 2022-11-06 DIAGNOSIS — D125 Benign neoplasm of sigmoid colon: Secondary | ICD-10-CM

## 2022-11-06 DIAGNOSIS — Z86010 Personal history of colonic polyps: Secondary | ICD-10-CM

## 2022-11-06 DIAGNOSIS — K2970 Gastritis, unspecified, without bleeding: Secondary | ICD-10-CM

## 2022-11-06 DIAGNOSIS — K5909 Other constipation: Secondary | ICD-10-CM

## 2022-11-06 DIAGNOSIS — R110 Nausea: Secondary | ICD-10-CM

## 2022-11-06 DIAGNOSIS — K2950 Unspecified chronic gastritis without bleeding: Secondary | ICD-10-CM | POA: Diagnosis not present

## 2022-11-06 DIAGNOSIS — K635 Polyp of colon: Secondary | ICD-10-CM | POA: Diagnosis not present

## 2022-11-06 MED ORDER — SODIUM CHLORIDE 0.9 % IV SOLN
500.0000 mL | INTRAVENOUS | Status: DC
Start: 2022-11-06 — End: 2022-11-06

## 2022-11-06 NOTE — Progress Notes (Signed)
Uneventful anesthetic. Report to pacu rn. Vss. Care resumed by rn. 

## 2022-11-06 NOTE — Patient Instructions (Signed)
Await pathology results.  Resume previous diet. Continue present medications.  Handouts on polyps, diverticulosis, hemorrhoids, and gastritis provided.  YOU HAD AN ENDOSCOPIC PROCEDURE TODAY AT THE [facility]:   Refer to the procedure report that was given to you for any specific questions about what was found during the examination.  If the procedure report does not answer your questions, please call your gastroenterologist to clarify.  If you requested that your care partner not be given the details of your procedure findings, then the procedure report has been included in a sealed envelope for you to review at your convenience later.  YOU SHOULD EXPECT: Some feelings of bloating in the abdomen. Passage of more gas than usual.  Walking can help get rid of the air that was put into your GI tract during the procedure and reduce the bloating. If you had a lower endoscopy (such as a colonoscopy or flexible sigmoidoscopy) you may notice spotting of blood in your stool or on the toilet paper. If you underwent a bowel prep for your procedure, you may not have a normal bowel movement for a few days.  Please Note:  You might notice some irritation and congestion in your nose or some drainage.  This is from the oxygen used during your procedure.  There is no need for concern and it should clear up in a day or so.  SYMPTOMS TO REPORT IMMEDIATELY:  Following lower endoscopy (colonoscopy or flexible sigmoidoscopy):  Excessive amounts of blood in the stool  Significant tenderness or worsening of abdominal pains  Swelling of the abdomen that is new, acute  Fever of 100F or higher  Following upper endoscopy (EGD)  Vomiting of blood or coffee ground material  New chest pain or pain under the shoulder blades  Painful or persistently difficult swallowing  New shortness of breath  Fever of 100F or higher  Black, tarry-looking stools  For urgent or emergent issues, a gastroenterologist can  be reached at any hour by calling [phone]. Do not use MyChart messaging for urgent concerns.    DIET:  We do recommend a small meal at first, but then you may proceed to your regular diet.  Drink plenty of fluids but you should avoid alcoholic beverages for 24 hours.  ACTIVITY:  You should plan to take it easy for the rest of today and you should NOT DRIVE or use heavy machinery until tomorrow (because of the sedation medicines used during the test).    FOLLOW UP: Our staff will call the number listed on your records the next business day following your procedure.  We will call around 7:15- 8:00 am to check on you and address any questions or concerns that you may have regarding the information given to you following your procedure. If we do not reach you, we will leave a message.     If any biopsies were taken you will be contacted by phone or by letter within the next 1-3 weeks.  Please call us at [phone] if you have not heard about the biopsies in 3 weeks.    SIGNATURES/CONFIDENTIALITY: You and/or your care partner have signed paperwork which will be entered into your electronic medical record.  These signatures attest to the fact that that the information above on your After Visit Summary has been reviewed and is understood.  Full responsibility of the confidentiality of this discharge information lies with you and/or your care-partner.

## 2022-11-06 NOTE — Progress Notes (Signed)
Signed         Chief Complaint: Discuss colonoscopy and nausea   HPI:    [name] is a 47 year old female with a past medical history as listed below, known to Dr. Chales Abrahams, who was referred to me by Egbert Garibaldi Excell Seltzer., MD for consideration of a colonoscopy and nausea.    09/01/2017 colonoscopy with grade 1 hemorrhoids and a few rare small diverticula in the sigmoid colon otherwise normal.  At that time recommended repeat in 5 years given that she has significant tubular adenomas at a younger age.    09/01/2017 EGD with minimal gastritis and otherwise normal.  Patient told to stop smoking.    03/09/2018 office visit with Dr. Chales Abrahams for epigastric pain with nausea.  Discussed negative EGD/colon 08/2017, ultrasound, HIDA with EF 2019 negative except for pain after Ensure ingestion, solid-phase GES 7/19, CT also normal and labs.  Discussed IBS with predominant constipation.  Recommended to follow a gluten-free diet and change from Omeprazole to Protonix 40 mg p.o. daily and given a trial of Motegrity.  Recommended MiraLAX as needed.    04/16/2022 CBC, BMP, vitamin D normal.    Today, the patient tells me that she is followed with Dr. Chales Abrahams for years.  Apparently after seeing him last she notes that when she was having all the nausea she had just quit smoking she was perimenopausal and had gained a lot of weight which made her slightly depressed.  Also had back surgery and could not move like she wanted to, due to this she was depressed and felt like she was constipated excetra.  She eventually went on Optavia and lost around 93 pounds and after she lost all that weight, about 3 months into the program she had no further GI issues and was having regular bowel habits with no nausea at all.  She had come off of all of her GI meds.      Most recently over the past few months she is now trying to get back to living and adding back in some normal foods into her diet but now that she has done that she is having  return of a lot of her GI issues including constipation.  Tells me she still has a bowel movement every day or every other day but sometimes it is just balls and she really feels like she cannot empty out.  Recalls that last night she was up all night feeling like she had to "have a baby", tells me anytime she would even sit to urinate she felt like she needed to have more stool.  Along with that she has noticed increase in her nausea over the past 3 months.  Patient is still on chronic Hydrocodone managed by the pain clinic for her back pain.  She is not on anything chronically for constipation but has tried a lot of stool softeners excetra lately.    Also tells me she is due for a surveillance colonoscopy.    Denies fever, chills, blood in her stool, vomiting or symptoms that awaken her from sleep.        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   Fibromyalgia 01/2017   Headache(784.0)      Migraines   Hemorrhoids     History  of anxiety 02/05/2017    on Xanax   History of cholecystectomy 02/05/2017   History of colon polyps 02/05/2017   History of cystitis (IC) 02/05/2017   History of fibromyalgia 02/05/2017   History of gastroesophageal reflux (GERD) 02/05/2017   History of IBS 02/05/2017   History of migraine 02/05/2017    on Topomax   Midline low back pain with sciatica 02/09/2015   Migraine 03/15/2015   Numbness and tingling of left leg 02/09/2015   Osteoarthritis 01/2017   Palpitation 03/30/2018           Past Surgical History:  Procedure Laterality Date   ABDOMINAL HYSTERECTOMY   2010   APPENDECTOMY   7/12   cesearean    '96 '00   DIAGNOSTIC LAPAROSCOPY   2001   LAPAROSCOPY   04/26/2011    Procedure: LAPAROSCOPY OPERATIVE;  Surgeon: Roselle Locus II;  Location: [facility]  ORS;  Service: Gynecology;  Laterality: N/A;  ablation of endometriosis   SALPINGOOPHORECTOMY   04/26/2011    Procedure: SALPINGO OOPHERECTOMY;  Surgeon: Roselle Locus II;  Location: [facility] ORS;  Service: Gynecology;  Laterality: Left;            Current Outpatient Medications  Medication Sig Dispense Refill   albuterol (VENTOLIN HFA) 108 (90 Base) MCG/ACT inhaler Inhale into the lungs every 6 (six) hours as needed for wheezing or shortness of breath.       atorvastatin (LIPITOR) 10 MG tablet Take 10 mg by mouth at bedtime.       celecoxib (CELEBREX) 100 MG capsule Take 1 capsule (100 mg total) by mouth 2 (two) times daily. 30 capsule 3   HYDROcodone-acetaminophen (NORCO) 7.5-325 MG tablet Take 1 tablet by mouth every 6 (six) hours as needed for moderate pain.       hydrOXYzine (ATARAX/VISTARIL) 25 MG tablet Take 25 mg by mouth at bedtime.       lidocaine (LIDODERM) 5 % Place 1 patch onto the skin daily.       metoCLOPramide (REGLAN) 10 MG tablet Take 1 tablet (10 mg total) by mouth 4 (four) times daily -  before meals and at bedtime. Take for 2 weeks. 56 tablet 2   omeprazole (PRILOSEC) 40 MG capsule Take 40 mg by mouth daily.       Probiotic Product (PROBIOTIC PO) Take 1 tablet by mouth daily.        topiramate (TOPAMAX) 25 MG tablet Take 3 tablets by mouth 2 (two) times daily.                 Current Facility-Administered Medications  Medication Dose Route Frequency Provider Last Rate Last Admin   0.9 %  sodium chloride infusion  500 mL Intravenous Once Lynann Bologna, MD             Allergies as of 09/05/2022   (No Known Allergies)           Family History  Problem Relation Age of Onset   Lung cancer Father 45   Brain cancer Father 76   High blood pressure Mother     Diabetes Mother     Lupus Maternal Grandmother     Lymphoma Maternal Grandmother     Heart disease Maternal Grandfather     Diabetes Maternal Grandfather        Social History         Socioeconomic History    Marital status: Divorced      Spouse name: Not on file  Number of children: Not on file   Years of education: Not on file   Highest education level: Not on file  Occupational History   Not on file  Tobacco Use   Smoking status: Every Day      Packs/day: .5      Types: Cigarettes   Smokeless tobacco: Never  Vaping Use   Vaping Use: Never used  Substance and Sexual Activity   Alcohol use: Not Currently   Drug use: No   Sexual activity: Not Currently  Other Topics Concern   Not on file  Social History Narrative   Not on file    Social Determinants of Health    Financial Resource Strain: Not on file  Food Insecurity: Not on file  Transportation Needs: Not on file  Physical Activity: Not on file  Stress: Not on file  Social Connections: Not on file  Intimate Partner Violence: Not on file      Review of Systems:    Constitutional: No weight loss, fever or chills Skin: No rash  Cardiovascular: No chest pain  Respiratory: No SOB  Gastrointestinal: See HPI and otherwise negative Genitourinary: No dysuria  Neurological: No headache, dizziness or syncope Musculoskeletal: No new muscle or joint pain Hematologic: No bleeding  Psychiatric: No history of depression or anxiety    Physical Exam:  Vital signs: BP 118/68   Pulse 77   Ht 5\' 6"  (1.676 m)   Wt 138 lb (62.6 kg)   BMI 22.27 kg/m     Constitutional:   Pleasant Caucasian female appears to be in NAD, Well developed, Well nourished, alert and cooperative Head:  Normocephalic and atraumatic. Eyes:   PEERL, EOMI. No icterus. Conjunctiva pink. Ears:  Normal auditory acuity. Neck:  Supple Throat: Oral cavity and pharynx without inflammation, swelling or lesion.  Respiratory: Respirations even and unlabored. Lungs clear to auscultation bilaterally.   No wheezes, crackles, or rhonchi.  Cardiovascular: Normal S1, S2. No MRG. Regular rate and rhythm. No peripheral edema, cyanosis or pallor.  Gastrointestinal:  Soft,  nondistended, mild generalized ttp. No rebound or guarding. Normal bowel sounds. No appreciable masses or hepatomegaly. Rectal:  Not performed.  Msk:  Symmetrical without gross deformities. Without edema, no deformity or joint abnormality.  Neurologic:  Alert and  oriented x4;  grossly normal neurologically.  Skin:   Dry and intact without significant lesions or rashes. Psychiatric: Oriented to person, place and time. Demonstrates good judgement and reason without abnormal affect or behaviors.   No recent labs or imaging.   Assessment: 1.  Chronic constipation: Was better on Optavia, now with more regular diet having issues, diagnosis of IBS-seen in the past 2.  Chronic nausea: Has returned with return of constipation; likely constipation related/functional 3.  History of adenomatous polyps: Last colonoscopy in 2019, repeat colonoscopy due now   Plan: 1.  Scheduled patient for diagnostic EGD and surveillance colonoscopy with Dr. Chales Abrahams at his next available.  Did provide the patient a detailed list of risks for the procedures and she agrees to proceed. Patient is appropriate for endoscopic procedure(s) in the ambulatory ([facility]) setting.  2.  Started the patient on Movantik 25 mg once daily #30 with 5 refills. 3.  Patient will have a 2-day bowel prep for her colonoscopy 4.  Prescribed Reglan 10 mg twice, 20 to 30 minutes before first half of prep and again before the second #2 with no refills 5.  Patient to follow in clinic per recommendations from Dr. Chales Abrahams after time  of procedures.   Sarah Meeker, PA-C            Attending physician's note   I have taken history, reviewed the chart and examined the patient. I performed a substantive portion of this encounter, including complete performance of at least one of the key components, in conjunction with the APP. I agree with the Advanced Practitioner's note, impression and recommendations.   For EGD/colon today   Edman Circle, MD [facility]  [facility] [phone]

## 2022-11-06 NOTE — Op Note (Signed)
[facility] Patient Name: [name] Procedure Date: 11/06/2022 2:49 PM MRN: [id number] Endoscopist: Lynann Bologna , MD, [id number] Age: 47 Referring MD:  Date of Birth: 1976-03-10 Gender: Female Account #: [id number] Procedure:                Colonoscopy Indications:              High risk colon cancer surveillance: Personal                            history of colonic polyps Medicines:                Monitored Anesthesia Care Procedure:                Pre-Anesthesia Assessment:                           - Prior to the procedure, a History and Physical                            was performed, and patient medications and                            allergies were reviewed. The patient's tolerance of                            previous anesthesia was also reviewed. The risks                            and benefits of the procedure and the sedation                            options and risks were discussed with the patient.                            All questions were answered, and informed consent                            was obtained. Prior Anticoagulants: The patient has                            taken no anticoagulant or antiplatelet agents. ASA                            Grade Assessment: II - A patient with mild systemic                            disease. After reviewing the risks and benefits,                            the patient was deemed in satisfactory condition to                            undergo the procedure.  After obtaining informed consent, the colonoscope                            was passed under direct vision. Throughout the                            procedure, the patient's blood pressure, pulse, and                            oxygen saturations were monitored continuously. The                            Olympus [id number]-[id number] (#[id number]) Colonoscope was                            introduced through the anus and advanced to  the 2                            cm into the ileum. The colonoscopy was performed                            without difficulty. The patient tolerated the                            procedure well. The quality of the bowel                            preparation was adequate to identify polyps greater                            than 5 mm in size. The terminal ileum, ileocecal                            valve, appendiceal orifice, and rectum were                            photographed. Scope In: 3:18:09 PM Scope Out: 3:37:05 PM Scope Withdrawal Time: 0 hours 11 minutes 51 seconds  Total Procedure Duration: 0 hours 18 minutes 56 seconds  Findings:                 A 6 mm polyp was found in the sigmoid colon. The                            polyp was sessile. The polyp was removed with a                            cold snare. Resection and retrieval were complete.                           Mild sigmoid diverticulosis. The colon was highly                            redundant.  Non-bleeding internal hemorrhoids were found during                            retroflexion. The hemorrhoids were small and Grade                            I (internal hemorrhoids that do not prolapse).                           The terminal ileum appeared normal.                           The exam was otherwise without abnormality on                            direct and retroflexion views. Complications:            No immediate complications. Estimated Blood Loss:     Estimated blood loss: none. Impression:               - One 6 mm polyp in the sigmoid colon, removed with                            a cold snare. Resected and retrieved.                           - Minimal sigmoid diverticulosis.                           - Non-bleeding internal hemorrhoids.                           - The examined portion of the ileum was normal.                           - The examination was otherwise  normal on direct                            and retroflexion views. Recommendation:           - Patient has a contact number available for                            emergencies. The signs and symptoms of potential                            delayed complications were discussed with the                            patient. Return to normal activities tomorrow.                            Written discharge instructions were provided to the                            patient.                           -  Resume previous diet.                           - Miralax 1 capful (17 grams) in 8 ounces of water                            PO daily.                           - Repeat colonoscopy date to be determined after                            pending pathology results are reviewed for                            surveillance.                           - The findings and recommendations were discussed                            with the patient's family. Lynann Bologna, MD 11/06/2022 3:44:58 PM This report has been signed electronically.

## 2022-11-06 NOTE — Op Note (Signed)
[facility] Patient Name: [name] Procedure Date: 11/06/2022 2:50 PM MRN: [id number] Endoscopist: Lynann Bologna , MD, [id number] Age: 47 Referring MD:  Date of Birth: Aug 06, 1975 Gender: Female Account #: [id number] Procedure:                Upper GI endoscopy Indications:              Epigastric abdominal pain with nausea Medicines:                Monitored Anesthesia Care Procedure:                Pre-Anesthesia Assessment:                           - Prior to the procedure, a History and Physical                            was performed, and patient medications and                            allergies were reviewed. The patient's tolerance of                            previous anesthesia was also reviewed. The risks                            and benefits of the procedure and the sedation                            options and risks were discussed with the patient.                            All questions were answered, and informed consent                            was obtained. Prior Anticoagulants: The patient has                            taken no anticoagulant or antiplatelet agents. ASA                            Grade Assessment: II - A patient with mild systemic                            disease. After reviewing the risks and benefits,                            the patient was deemed in satisfactory condition to                            undergo the procedure.                           After obtaining informed consent, the endoscope was  passed under direct vision. Throughout the                            procedure, the patient's blood pressure, pulse, and                            oxygen saturations were monitored continuously. The                            Olympus Scope [id number] was introduced through the                            mouth, and advanced to the second part of duodenum.                            The upper GI  endoscopy was accomplished without                            difficulty. The patient tolerated the procedure                            well. Scope In: Scope Out: Findings:                 The examined esophagus was normal with well-defined                            Z-line at 36 cm, examined by NBI.                           Localized minimal inflammation characterized by                            erythema was found in the gastric antrum. Biopsies                            were taken with a cold forceps for histology.                           The examined duodenum was normal. Complications:            No immediate complications. Estimated Blood Loss:     Estimated blood loss: none. Impression:               - Mild gastritis Recommendation:           - Patient has a contact number available for                            emergencies. The signs and symptoms of potential                            delayed complications were discussed with the                            patient. Return to normal activities tomorrow.  Written discharge instructions were provided to the                            patient.                           - Resume previous diet.                           - Continue present medications.                           - Await pathology results.                           - The findings and recommendations were discussed                            with the patient's family. If still with problems,                            solid-phase gastric emptying scan and further                            workup.                           - Follow-up GI clinic as needed. Lynann Bologna, MD 11/06/2022 3:39:27 PM This report has been signed electronically.

## 2022-11-06 NOTE — Progress Notes (Signed)
Called to room to assist during endoscopic procedure.  Patient ID and intended procedure confirmed with present staff. Received instructions for my participation in the procedure from the performing physician.  

## 2022-11-07 ENCOUNTER — Telehealth: Payer: Self-pay

## 2022-11-07 NOTE — Telephone Encounter (Signed)
  Follow up Call-     11/06/2022    2:41 PM  Call back number  Post procedure Call Back phone  # [phone]  Permission to leave phone message Yes     Patient questions:  Do you have a fever, pain , or abdominal swelling? No. Pain Score  0 *  Have you tolerated food without any problems? Yes.    Have you been able to return to your normal activities? Yes.    Do you have any questions about your discharge instructions: Diet   No. Medications  No. Follow up visit  No.  Do you have questions or concerns about your Care? No.  Actions: * If pain score is 4 or above: No action needed, pain <4.

## 2022-11-22 ENCOUNTER — Encounter: Payer: Self-pay | Admitting: Gastroenterology

## 2022-12-12 ENCOUNTER — Other Ambulatory Visit: Payer: Self-pay

## 2022-12-12 MED ORDER — LUBIPROSTONE 24 MCG PO CAPS: 24.0000 ug | ORAL_CAPSULE | Freq: Two times a day (BID) | ORAL | 0 refills | Status: AC

## 2022-12-12 MED ORDER — LUBIPROSTONE 24 MCG PO CAPS: 24.0000 ug | ORAL_CAPSULE | Freq: Two times a day (BID) | ORAL | 0 refills | Status: DC

## 2022-12-12 NOTE — Addendum Note (Signed)
Addended by: Chaney Malling on: 12/12/2022 04:36 PM   Modules accepted: Orders

## 2022-12-12 NOTE — Telephone Encounter (Signed)
Prescription sent in for Amitiza & patient has been notified via mychart.

## 2022-12-12 NOTE — Telephone Encounter (Signed)
Mychart message sent to patient.

## 2022-12-12 NOTE — Telephone Encounter (Signed)
Per patient's message, she was not able to pick up the [facility] d/t insurance & has never tried [employer] before. Will send to Dr. Chales Abrahams to see if this is an option for her.

## 2023-03-13 ENCOUNTER — Other Ambulatory Visit: Payer: Self-pay | Admitting: Gastroenterology
